# Patient Record
Sex: Male | Born: 1960 | Race: White | Hispanic: No | Marital: Married | Smoking: Current every day smoker
Health system: Southern US, Community
[De-identification: ages and names within clinical notes are randomized; demographics above are authoritative.]

## PROBLEM LIST (undated history)

## (undated) DIAGNOSIS — Z87442 Personal history of urinary calculi: Secondary | ICD-10-CM

## (undated) DIAGNOSIS — M1990 Unspecified osteoarthritis, unspecified site: Secondary | ICD-10-CM

## (undated) DIAGNOSIS — I10 Essential (primary) hypertension: Secondary | ICD-10-CM

## (undated) DIAGNOSIS — K219 Gastro-esophageal reflux disease without esophagitis: Secondary | ICD-10-CM

## (undated) HISTORY — PX: EYE SURGERY: SHX253

## (undated) HISTORY — PX: TYMPANOSTOMY TUBE PLACEMENT: SHX32

---

## 2013-12-19 ENCOUNTER — Other Ambulatory Visit: Payer: Self-pay | Admitting: Orthopedic Surgery

## 2013-12-19 DIAGNOSIS — M25561 Pain in right knee: Secondary | ICD-10-CM

## 2013-12-23 ENCOUNTER — Ambulatory Visit
Admission: RE | Admit: 2013-12-23 | Discharge: 2013-12-23 | Disposition: A | Discharge status: Home / Self Care | Payer: BC Managed Care – PPO | Source: Ambulatory Visit | Attending: Orthopedic Surgery | Admitting: Orthopedic Surgery

## 2013-12-23 DIAGNOSIS — M25561 Pain in right knee: Secondary | ICD-10-CM

## 2014-05-23 DIAGNOSIS — Z87442 Personal history of urinary calculi: Secondary | ICD-10-CM

## 2014-05-23 HISTORY — PX: OTHER SURGICAL HISTORY: SHX169

## 2014-05-23 HISTORY — DX: Personal history of urinary calculi: Z87.442

## 2016-01-04 IMAGING — MR MR KNEE*R* W/O CM
4 of 6 series · 19 of 40 positions shown · non-contrast
Comparison: None.

CLINICAL DATA: Medial and posterior right knee pain.

EXAM:
MRI OF THE RIGHT KNEE WITHOUT CONTRAST
TECHNIQUE: Multiplanar, multisequence MR imaging of the knee was performed. No
intravenous contrast was administered.

[Series 3: PD fat-sat · axial · 4.0mm · 0.27mm/px · z∈[-23,+92]mm · 7 of 24 slices shown (1 of 4)]
[im 1/24]
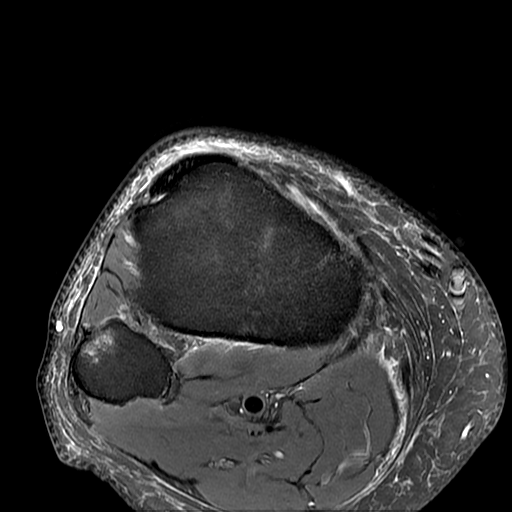
[im 4/24]
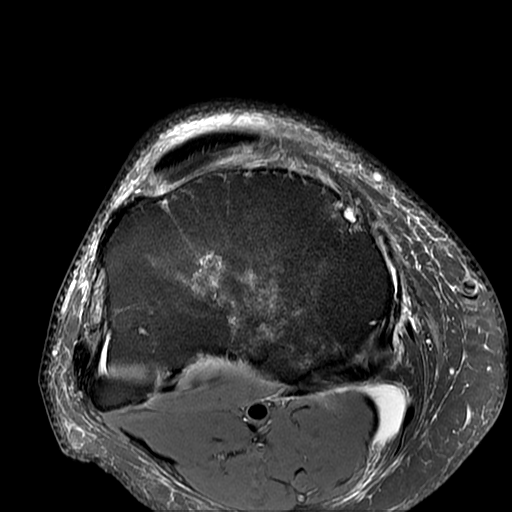
[im 8/24]
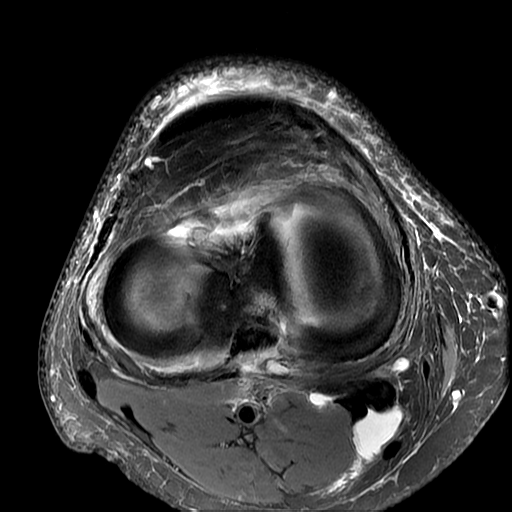
[im 12/24]
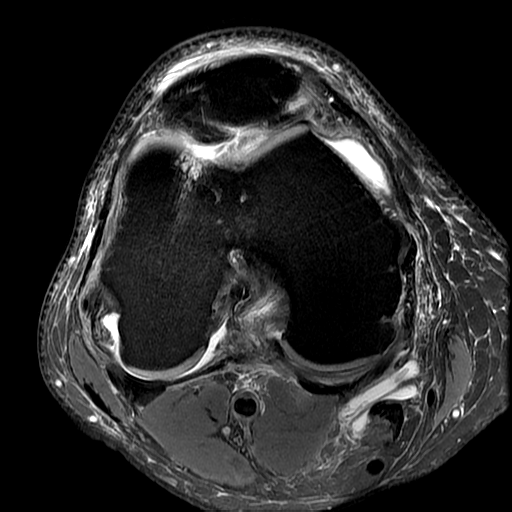
[im 16/24]
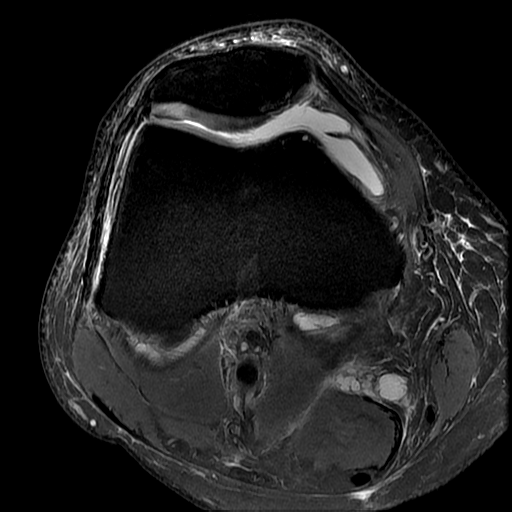
[im 20/24]
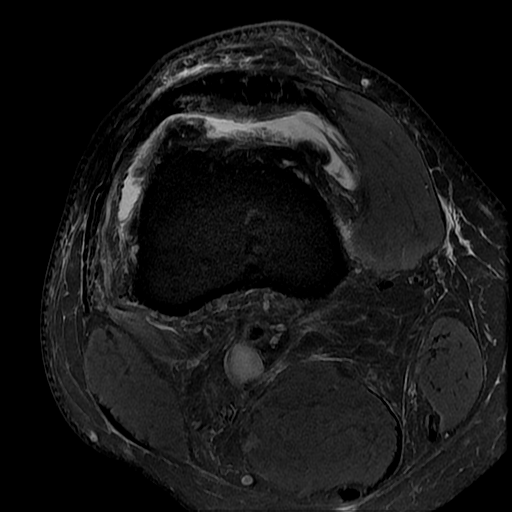
[im 24/24]
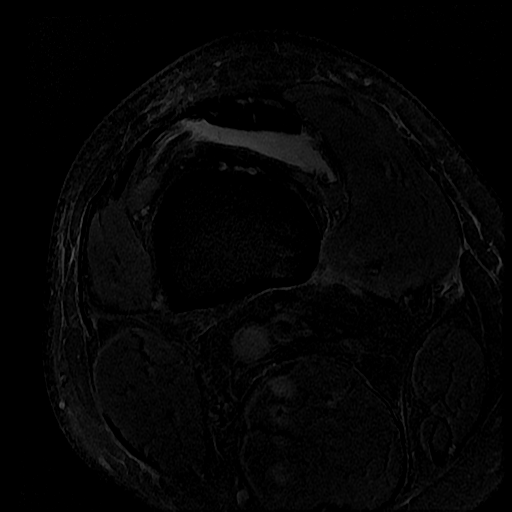

[Series 4: PD fat-sat · sagittal · 3.0mm · 0.27mm/px · 6 of 28 slices shown (2 of 4)]
[im 1/28]
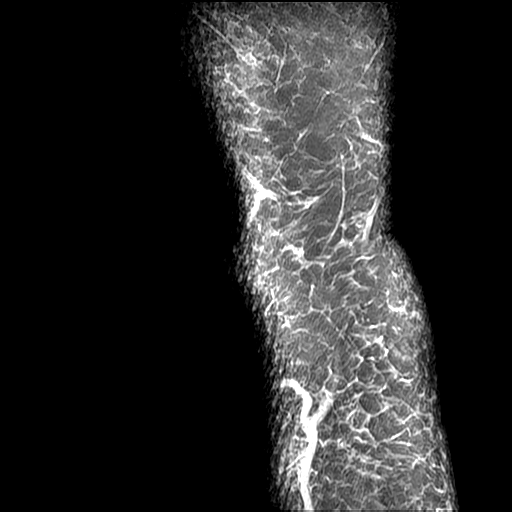
[im 4/28]
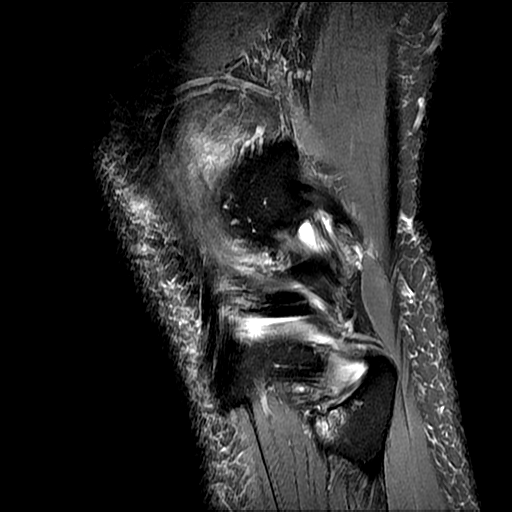
[im 8/28]
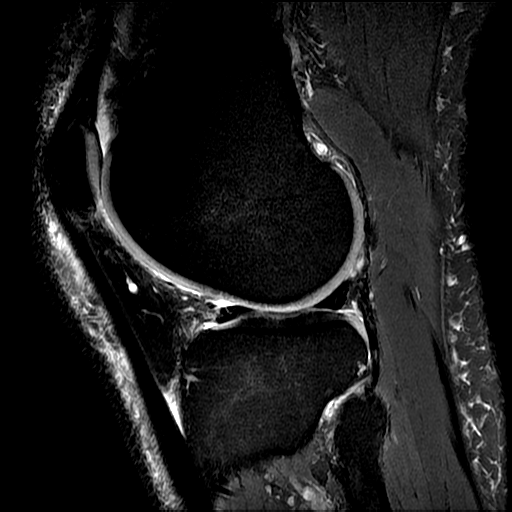
[im 12/28]
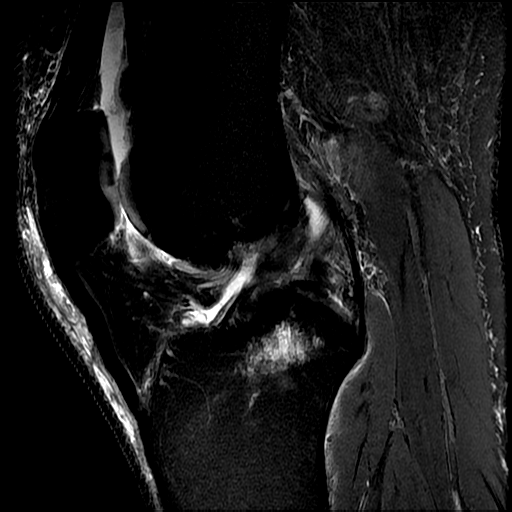
[im 16/28]
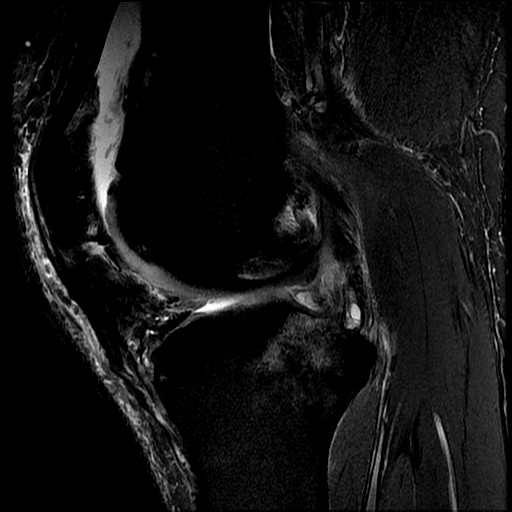
[im 24/28]
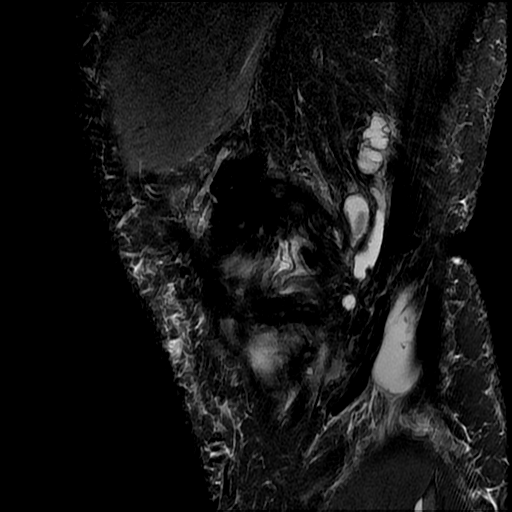

[Series 7: PD fat-sat · coronal · 4.0mm · 0.31mm/px · 3 of 26 slices shown (3 of 4)]
[im 5/26]
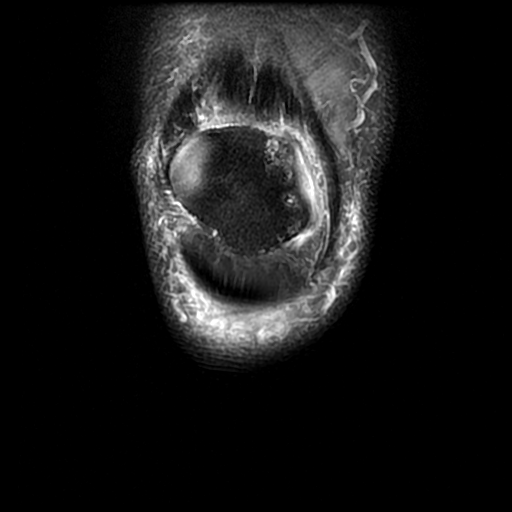
[im 13/26]
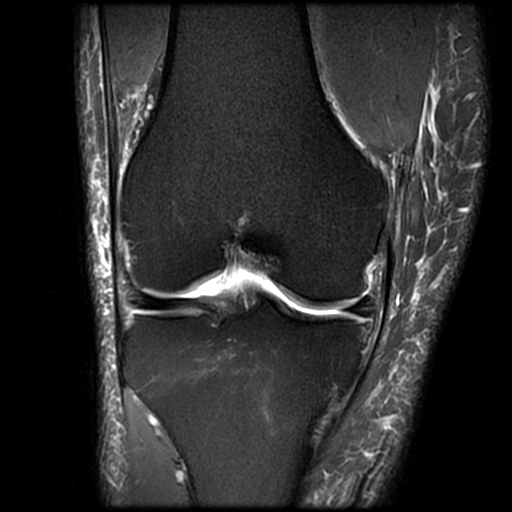
[im 21/26]
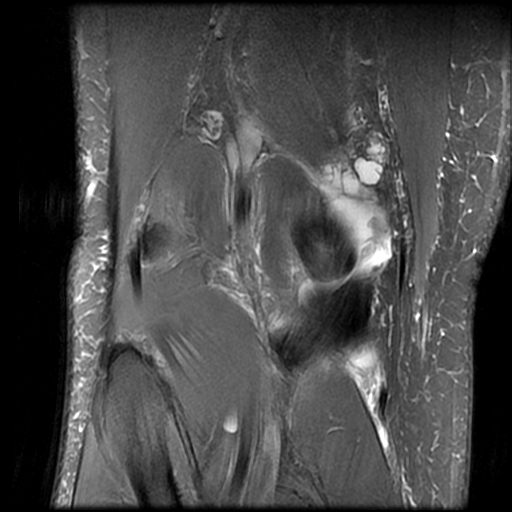

[Series 8: PD fat-sat · coronal · 2.0mm · 0.31mm/px · 3 of 13 slices shown (4 of 4)]
[im 1/13]
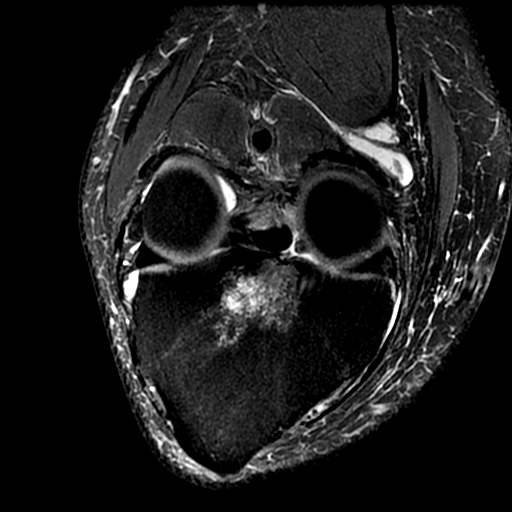
[im 9/13]
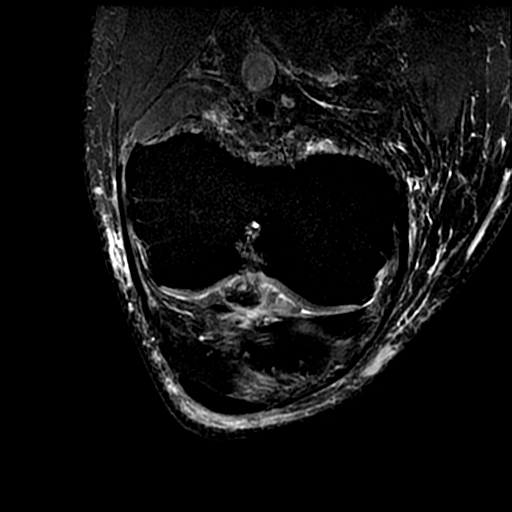
[im 13/13]
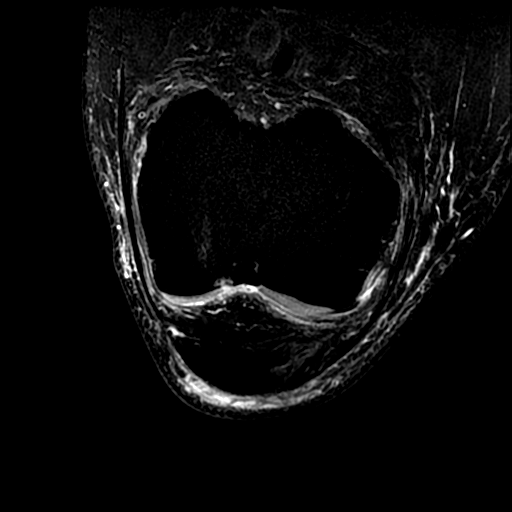

[19 of 40 positions shown; findings below may reference images not displayed]

FINDINGS: MENISCI

Medial meniscus:  Intact.

Lateral meniscus:  Intact.

LIGAMENTS

Cruciates:  Normal.

Collaterals: Intact. Small amount of fluid deep to the distal MCL
consistent with slight medial bursitis.

CARTILAGE

Patellofemoral: Grade 4 chondromalacia of the medial facet of the
patella and of the lateral aspect of the trochlear groove.

Medial:  Minimal chondromalacia of the femoral condyle.

Lateral:  Normal.

Joint:  Moderate joint effusion.

Popliteal Fossa:  6.4 x 1.5 x 1.1 cm Baker's cyst.

Extensor Mechanism:  Normal.

Bones: Focal intraosseous ganglion in the posterior central aspect
of the proximal tibia adjacent to the posterior cruciate ligament
insertion.

Osteoarthritis of the proximal tibiofibular joint with subcortical
cysts and edema.
IMPRESSION: 1. Chondromalacia of the patellofemoral compartment.
2. Moderate joint effusion with leaking Baker's cyst.
3. Osteoarthritis of the proximal tibiofibular joint.
4. Slight medial bursitis.

## 2018-01-26 DIAGNOSIS — M1711 Unilateral primary osteoarthritis, right knee: Secondary | ICD-10-CM | POA: Diagnosis present

## 2018-02-02 ENCOUNTER — Ambulatory Visit: Payer: Self-pay | Admitting: Physician Assistant

## 2018-02-02 NOTE — H&P (View-Only) (Signed)
TOTAL KNEE ADMISSION H&P  Patient is being admitted for right total knee arthroplasty.  Subjective:  Chief Complaint:right knee pain.  HPI: Hunter Vaughn, 57 y.o. male, has a history of pain and functional disability in the right knee due to arthritis and has failed non-surgical conservative treatments for greater than 12 weeks to includeNSAID's and/or analgesics, corticosteriod injections and activity modification.  Onset of symptoms was gradual, starting 6 years ago with gradually worsening course since that time. The patient noted no past surgery on the right knee(s).  Patient currently rates pain in the right knee(s) at 10 out of 10 with activity. Patient has night pain, worsening of pain with activity and weight bearing, pain that interferes with activities of daily living, pain with passive range of motion, crepitus and joint swelling.  Patient has evidence of periarticular osteophytes and joint space narrowing by imaging studies. There is no active infection.  Patient Active Problem List   Diagnosis Date Noted  . Primary localized osteoarthritis of right knee 01/26/2018   No past medical history on file.    No current outpatient medications on file.   No current facility-administered medications for this visit.    Allergies not on file  Social History   Tobacco Use  . Smoking status: Not on file  Substance Use Topics  . Alcohol use: Not on file    No family history on file.   Review of Systems  Musculoskeletal: Positive for joint pain.  All other systems reviewed and are negative.   Objective:  Physical Exam  Constitutional: He is oriented to person, place, and time. He appears well-developed and well-nourished. No distress.  HENT:  Head: Normocephalic and atraumatic.  Eyes: Pupils are equal, round, and reactive to light. Conjunctivae and EOM are normal.  Neck: Normal range of motion. Neck supple.  Cardiovascular: Normal rate, regular rhythm, normal heart sounds and  intact distal pulses.  Respiratory: Effort normal and breath sounds normal. No respiratory distress. He has no wheezes.  GI: Soft. Bowel sounds are normal. He exhibits no distension. There is no tenderness.  Musculoskeletal:       Right knee: He exhibits swelling, deformity and bony tenderness. He exhibits normal range of motion, no effusion and no erythema. Tenderness found. Medial joint line and lateral joint line tenderness noted.  Lymphadenopathy:    He has no cervical adenopathy.  Neurological: He is alert and oriented to person, place, and time.  Skin: Skin is warm and dry. No rash noted. No erythema.  Psychiatric: He has a normal mood and affect. His behavior is normal.    Vital signs in last 24 hours: @VSRANGES @  Labs:   There is no height or weight on file to calculate BMI.   Imaging Review Plain radiographs demonstrate moderate degenerative joint disease of the right knee(s). The overall alignment issignificant varus. The bone quality appears to be good for age and reported activity level.   Preoperative templating of the joint replacement has been completed, documented, and submitted to the Operating Room personnel in order to optimize intra-operative equipment management.   Anticipated LOS equal to or greater than 2 midnights due to - Age 59 and older with one or more of the following:  - Obesity  - Expected need for hospital services (PT, OT, Nursing) required for safe  discharge  - Anticipated need for postoperative skilled nursing care or inpatient rehab  - Active co-morbidities: None OR   - Unanticipated findings during/Post Surgery: None  - Patient is a  high risk of re-admission due to: None     Assessment/Plan:  End stage arthritis, right knee   The patient history, physical examination, clinical judgment of the provider and imaging studies are consistent with end stage degenerative joint disease of the right knee(s) and total knee arthroplasty is  deemed medically necessary. The treatment options including medical management, injection therapy arthroscopy and arthroplasty were discussed at length. The risks and benefits of total knee arthroplasty were presented and reviewed. The risks due to aseptic loosening, infection, stiffness, patella tracking problems, thromboembolic complications and other imponderables were discussed. The patient acknowledged the explanation, agreed to proceed with the plan and consent was signed. Patient is being admitted for inpatient treatment for surgery, pain control, PT, OT, prophylactic antibiotics, VTE prophylaxis, progressive ambulation and ADL's and discharge planning. The patient is planning to be discharged home with home health services

## 2018-02-02 NOTE — H&P (Signed)
TOTAL KNEE ADMISSION H&P  Patient is being admitted for right total knee arthroplasty.  Subjective:  Chief Complaint:right knee pain.  HPI: Hunter Vaughn, 57 y.o. male, has a history of pain and functional disability in the right knee due to arthritis and has failed non-surgical conservative treatments for greater than 12 weeks to includeNSAID's and/or analgesics, corticosteriod injections and activity modification.  Onset of symptoms was gradual, starting 6 years ago with gradually worsening course since that time. The patient noted no past surgery on the right knee(s).  Patient currently rates pain in the right knee(s) at 10 out of 10 with activity. Patient has night pain, worsening of pain with activity and weight bearing, pain that interferes with activities of daily living, pain with passive range of motion, crepitus and joint swelling.  Patient has evidence of periarticular osteophytes and joint space narrowing by imaging studies. There is no active infection.  Patient Active Problem List   Diagnosis Date Noted  . Primary localized osteoarthritis of right knee 01/26/2018   No past medical history on file.    No current outpatient medications on file.   No current facility-administered medications for this visit.    Allergies not on file  Social History   Tobacco Use  . Smoking status: Not on file  Substance Use Topics  . Alcohol use: Not on file    No family history on file.   Review of Systems  Musculoskeletal: Positive for joint pain.  All other systems reviewed and are negative.   Objective:  Physical Exam  Constitutional: He is oriented to person, place, and time. He appears well-developed and well-nourished. No distress.  HENT:  Head: Normocephalic and atraumatic.  Eyes: Pupils are equal, round, and reactive to light. Conjunctivae and EOM are normal.  Neck: Normal range of motion. Neck supple.  Cardiovascular: Normal rate, regular rhythm, normal heart sounds and  intact distal pulses.  Respiratory: Effort normal and breath sounds normal. No respiratory distress. He has no wheezes.  GI: Soft. Bowel sounds are normal. He exhibits no distension. There is no tenderness.  Musculoskeletal:       Right knee: He exhibits swelling, deformity and bony tenderness. He exhibits normal range of motion, no effusion and no erythema. Tenderness found. Medial joint line and lateral joint line tenderness noted.  Lymphadenopathy:    He has no cervical adenopathy.  Neurological: He is alert and oriented to person, place, and time.  Skin: Skin is warm and dry. No rash noted. No erythema.  Psychiatric: He has a normal mood and affect. His behavior is normal.    Vital signs in last 24 hours: @VSRANGES@  Labs:   There is no height or weight on file to calculate BMI.   Imaging Review Plain radiographs demonstrate moderate degenerative joint disease of the right knee(s). The overall alignment issignificant varus. The bone quality appears to be good for age and reported activity level.   Preoperative templating of the joint replacement has been completed, documented, and submitted to the Operating Room personnel in order to optimize intra-operative equipment management.   Anticipated LOS equal to or greater than 2 midnights due to - Age 65 and older with one or more of the following:  - Obesity  - Expected need for hospital services (PT, OT, Nursing) required for safe  discharge  - Anticipated need for postoperative skilled nursing care or inpatient rehab  - Active co-morbidities: None OR   - Unanticipated findings during/Post Surgery: None  - Patient is a   high risk of re-admission due to: None     Assessment/Plan:  End stage arthritis, right knee   The patient history, physical examination, clinical judgment of the provider and imaging studies are consistent with end stage degenerative joint disease of the right knee(s) and total knee arthroplasty is  deemed medically necessary. The treatment options including medical management, injection therapy arthroscopy and arthroplasty were discussed at length. The risks and benefits of total knee arthroplasty were presented and reviewed. The risks due to aseptic loosening, infection, stiffness, patella tracking problems, thromboembolic complications and other imponderables were discussed. The patient acknowledged the explanation, agreed to proceed with the plan and consent was signed. Patient is being admitted for inpatient treatment for surgery, pain control, PT, OT, prophylactic antibiotics, VTE prophylaxis, progressive ambulation and ADL's and discharge planning. The patient is planning to be discharged home with home health services  

## 2018-02-08 ENCOUNTER — Other Ambulatory Visit (HOSPITAL_COMMUNITY): Payer: Self-pay | Admitting: *Deleted

## 2018-02-08 NOTE — Progress Notes (Signed)
Medical /cardiac clearance note amy moon np 01-04-18 on chart

## 2018-02-08 NOTE — Patient Instructions (Addendum)
Hunter Vaughn  02/08/2018   Your procedure is scheduled on: 02-16-18  [facility]  Report to admitting at  745 AM    Call this number if you have problems the morning of surgery [phone]   Remember: Do not eat food or drink liquids :After Midnight. BRUSH YOUR TEETH MORNING OF SURGERY AND RINSE YOUR MOUTH OUT, NO CHEWING GUM CANDY OR MINTS.     Take these medicines the morning of surgery with A SIP OF WATER: ALBUTEROL INHALER IF NEEDED AND BRING INHALER, ROSUVASTATIN (CRESTOR), PANTAPRAZOLE (PROTONIX)                               You may not have any metal on your body including hair pins and              piercings  Do not wear jewelry, make-up, lotions, powders or perfumes, deodorant             Do not wear nail polish.  Do not shave  48 hours prior to surgery.              Men may shave face and neck.   Do not bring valuables to the hospital. [place] IS NOT             RESPONSIBLE   FOR VALUABLES.  Contacts, dentures or bridgework may not be worn into surgery.  Leave suitcase in the car. After surgery it may be brought to your room.     Patients discharged the day of surgery will not be allowed to drive home.  Name and phone number of your driver:  Special Instructions: N/A              Please read over the following fact sheets you were given: _____________________________________________________________________             [facility] Before surgery, you can play an important role.  Because skin is not sterile, your skin needs to be as free of germs as possible.  You can reduce the number of germs on your skin by washing with CHG (chlorahexidine gluconate) soap before surgery.  CHG is an antiseptic cleaner which kills germs and bonds with the skin to continue killing germs even after washing. Please DO NOT use if you have an allergy to CHG or antibacterial soaps.  If your skin becomes  reddened/irritated stop using the CHG and inform your nurse when you arrive at Short Stay. Do not shave (including legs and underarms) for at least 48 hours prior to the first CHG shower.  You may shave your face/neck. Please follow these instructions carefully:  1.  Shower with CHG Soap the night before surgery and the  morning of Surgery.  2.  If you choose to wash your hair, wash your hair first as usual with your  normal  shampoo.  3.  After you shampoo, rinse your hair and body thoroughly to remove the  shampoo.                           4.  Use CHG as you would any other liquid soap.  You can apply chg directly  to the skin and wash  Gently with a scrungie or clean washcloth.  5.  Apply the CHG Soap to your body ONLY FROM THE NECK DOWN.   Do not use on face/ open                           Wound or open sores. Avoid contact with eyes, ears mouth and genitals (private parts).                       Wash face,  Genitals (private parts) with your normal soap.             6.  Wash thoroughly, paying special attention to the area where your surgery  will be performed.  7.  Thoroughly rinse your body with warm water from the neck down.  8.  DO NOT shower/wash with your normal soap after using and rinsing off  the CHG Soap.                9.  Pat yourself dry with a clean towel.            10.  Wear clean pajamas.            11.  Place clean sheets on your bed the night of your first shower and do not  sleep with pets. Day of Surgery : Do not apply any lotions/deodorants the morning of surgery.  Please wear clean clothes to the hospital/surgery center.  FAILURE TO FOLLOW THESE INSTRUCTIONS MAY RESULT IN THE CANCELLATION OF YOUR SURGERY PATIENT SIGNATURE_________________________________  NURSE SIGNATURE__________________________________  ________________________________________________________________________  WHAT IS A BLOOD TRANSFUSION? Blood Transfusion Information  A  transfusion is the replacement of blood or some of its parts. Blood is made up of multiple cells which provide different functions.  Red blood cells carry oxygen and are used for blood loss replacement.  White blood cells fight against infection.  Platelets control bleeding.  Plasma helps clot blood.  Other blood products are available for specialized needs, such as hemophilia or other clotting disorders. BEFORE THE TRANSFUSION  Who gives blood for transfusions?   Healthy volunteers who are fully evaluated to make sure their blood is safe. This is blood bank blood. Transfusion therapy is the safest it has ever been in the practice of medicine. Before blood is taken from a donor, a complete history is taken to make sure that person has no history of diseases nor engages in risky social behavior (examples are intravenous drug use or sexual activity with multiple partners). The donor's travel history is screened to minimize risk of transmitting infections, such as malaria. The donated blood is tested for signs of infectious diseases, such as HIV and hepatitis. The blood is then tested to be sure it is compatible with you in order to minimize the chance of a transfusion reaction. If you or a relative donates blood, this is often done in anticipation of surgery and is not appropriate for emergency situations. It takes many days to process the donated blood. RISKS AND COMPLICATIONS Although transfusion therapy is very safe and saves many lives, the main dangers of transfusion include:   Getting an infectious disease.  Developing a transfusion reaction. This is an allergic reaction to something in the blood you were given. Every precaution is taken to prevent this. The decision to have a blood transfusion has been considered carefully by your caregiver before blood is given. Blood is not given unless the benefits outweigh  the risks. AFTER THE TRANSFUSION  Right after receiving a blood transfusion,  you will usually feel much better and more energetic. This is especially true if your red blood cells have gotten low (anemic). The transfusion raises the level of the red blood cells which carry oxygen, and this usually causes an energy increase.  The nurse administering the transfusion will monitor you carefully for complications. HOME CARE INSTRUCTIONS  No special instructions are needed after a transfusion. You may find your energy is better. Speak with your caregiver about any limitations on activity for underlying diseases you may have. SEEK MEDICAL CARE IF:   Your condition is not improving after your transfusion.  You develop redness or irritation at the intravenous (IV) site. SEEK IMMEDIATE MEDICAL CARE IF:  Any of the following symptoms occur over the next 12 hours:  Shaking chills.  You have a temperature by mouth above 102 F (38.9 C), not controlled by medicine.  Chest, back, or muscle pain.  People around you feel you are not acting correctly or are confused.  Shortness of breath or difficulty breathing.  Dizziness and fainting.  You get a rash or develop hives.  You have a decrease in urine output.  Your urine turns a dark color or changes to pink, red, or brown. Any of the following symptoms occur over the next 10 days:  You have a temperature by mouth above 102 F (38.9 C), not controlled by medicine.  Shortness of breath.  Weakness after normal activity.  The white part of the eye turns yellow (jaundice).  You have a decrease in the amount of urine or are urinating less often.  Your urine turns a dark color or changes to pink, red, or brown. Document Released: 05/06/2000 Document Revised: 08/01/2011 Document Reviewed: 12/24/2007 [facility] Patient Information 2014 [place], [place].  _______________________________________________________________________

## 2018-02-12 ENCOUNTER — Other Ambulatory Visit: Payer: Self-pay

## 2018-02-12 ENCOUNTER — Encounter (HOSPITAL_COMMUNITY)
Admission: RE | Admit: 2018-02-12 | Discharge: 2018-02-12 | Disposition: A | Discharge status: Home / Self Care | Payer: Managed Care, Other (non HMO) | Source: Ambulatory Visit | Attending: Orthopedic Surgery | Admitting: Orthopedic Surgery

## 2018-02-12 ENCOUNTER — Ambulatory Visit (HOSPITAL_COMMUNITY)
Admission: RE | Admit: 2018-02-12 | Discharge: 2018-02-12 | Disposition: A | Discharge status: Home / Self Care | Payer: Managed Care, Other (non HMO) | Source: Ambulatory Visit | Attending: Physician Assistant | Admitting: Physician Assistant

## 2018-02-12 ENCOUNTER — Encounter (HOSPITAL_COMMUNITY): Payer: Self-pay

## 2018-02-12 DIAGNOSIS — M1711 Unilateral primary osteoarthritis, right knee: Secondary | ICD-10-CM | POA: Insufficient documentation

## 2018-02-12 DIAGNOSIS — Z01818 Encounter for other preprocedural examination: Secondary | ICD-10-CM | POA: Insufficient documentation

## 2018-02-12 HISTORY — DX: Gastro-esophageal reflux disease without esophagitis: K21.9

## 2018-02-12 HISTORY — DX: Unspecified osteoarthritis, unspecified site: M19.90

## 2018-02-12 HISTORY — DX: Essential (primary) hypertension: I10

## 2018-02-12 HISTORY — DX: Personal history of urinary calculi: Z87.442

## 2018-02-12 LAB — CBC WITH DIFFERENTIAL/PLATELET
BASOS PCT: 0 %
Basophils Absolute: 0 10*3/uL (ref 0.0–0.1)
EOS ABS: 0.4 10*3/uL (ref 0.0–0.7)
EOS PCT: 3 %
HCT: 48.5 % (ref 39.0–52.0)
HEMOGLOBIN: 16.2 g/dL (ref 13.0–17.0)
Lymphocytes Relative: 22 %
Lymphs Abs: 2.5 10*3/uL (ref 0.7–4.0)
MCH: 30.3 pg (ref 26.0–34.0)
MCHC: 33.4 g/dL (ref 30.0–36.0)
MCV: 90.7 fL (ref 78.0–100.0)
MONO ABS: 1 10*3/uL (ref 0.1–1.0)
Monocytes Relative: 8 %
NEUTROS PCT: 67 %
Neutro Abs: 7.6 10*3/uL (ref 1.7–7.7)
PLATELETS: 287 10*3/uL (ref 150–400)
RBC: 5.35 MIL/uL (ref 4.22–5.81)
RDW: 13.6 % (ref 11.5–15.5)
WBC: 11.5 10*3/uL — ABNORMAL HIGH (ref 4.0–10.5)

## 2018-02-12 LAB — COMPREHENSIVE METABOLIC PANEL
ALBUMIN: 4.2 g/dL (ref 3.5–5.0)
ALT: 54 U/L — ABNORMAL HIGH (ref 0–44)
ANION GAP: 10 (ref 5–15)
AST: 31 U/L (ref 15–41)
Alkaline Phosphatase: 87 U/L (ref 38–126)
BUN: 16 mg/dL (ref 6–20)
CO2: 33 mmol/L — AB (ref 22–32)
Calcium: 9.5 mg/dL (ref 8.9–10.3)
Chloride: 99 mmol/L (ref 98–111)
Creatinine, Ser: 0.94 mg/dL (ref 0.61–1.24)
GFR calc non Af Amer: >60 mL/min (ref >60)
GLUCOSE: 103 mg/dL — AB (ref 70–99)
POTASSIUM: 4.2 mmol/L (ref 3.5–5.1)
SODIUM: 142 mmol/L (ref 135–145)
Total Bilirubin: 0.7 mg/dL (ref 0.3–1.2)
Total Protein: 7.5 g/dL (ref 6.5–8.1)

## 2018-02-12 LAB — URINALYSIS, ROUTINE W REFLEX MICROSCOPIC
BILIRUBIN URINE: NEGATIVE
GLUCOSE, UA: NEGATIVE mg/dL
HGB URINE DIPSTICK: NEGATIVE
Ketones, ur: NEGATIVE mg/dL
LEUKOCYTES UA: NEGATIVE
Nitrite: NEGATIVE
PH: 7 (ref 5.0–8.0)
PROTEIN: NEGATIVE mg/dL
Specific Gravity, Urine: 1.021 (ref 1.005–1.030)

## 2018-02-12 LAB — PROTIME-INR
INR: 0.9
Prothrombin Time: 12.1 seconds (ref 11.4–15.2)

## 2018-02-12 LAB — SURGICAL PCR SCREEN
MRSA, PCR: NEGATIVE
Staphylococcus aureus: NEGATIVE

## 2018-02-12 LAB — APTT: aPTT: 36 seconds (ref 24–36)

## 2018-02-12 NOTE — Progress Notes (Signed)
Chart left with Hunter Vaughn for follow up type and screen and urine culture

## 2018-02-13 LAB — URINE CULTURE: CULTURE: NO GROWTH

## 2018-02-13 LAB — ABO/RH: ABO/RH(D): O POS

## 2018-02-15 MED ORDER — BUPIVACAINE LIPOSOME 1.3 % IJ SUSP
20.0000 mL | Freq: Once | INTRAMUSCULAR | Status: DC
  Filled 2018-02-15: qty 20

## 2018-02-15 MED ORDER — TRANEXAMIC ACID 1000 MG/10ML IV SOLN
2000.0000 mg | INTRAVENOUS | Status: DC
  Filled 2018-02-15: qty 20

## 2018-02-16 ENCOUNTER — Ambulatory Visit (HOSPITAL_COMMUNITY): Payer: Managed Care, Other (non HMO) | Admitting: Certified Registered Nurse Anesthetist

## 2018-02-16 ENCOUNTER — Inpatient Hospital Stay (HOSPITAL_COMMUNITY)
Admission: AD | Admit: 2018-02-16 | Discharge: 2018-02-17 | DRG: 470 | Disposition: A | Discharge status: Home Health | Payer: Managed Care, Other (non HMO) | Source: Ambulatory Visit | Attending: Orthopedic Surgery | Admitting: Orthopedic Surgery

## 2018-02-16 ENCOUNTER — Encounter (HOSPITAL_COMMUNITY)
Admission: AD | Disposition: A | Discharge status: Home Health | Payer: Self-pay | Source: Ambulatory Visit | Attending: Orthopedic Surgery

## 2018-02-16 ENCOUNTER — Other Ambulatory Visit: Payer: Self-pay

## 2018-02-16 ENCOUNTER — Encounter (HOSPITAL_COMMUNITY): Payer: Self-pay | Admitting: Emergency Medicine

## 2018-02-16 DIAGNOSIS — Z96659 Presence of unspecified artificial knee joint: Secondary | ICD-10-CM

## 2018-02-16 DIAGNOSIS — E669 Obesity, unspecified: Secondary | ICD-10-CM | POA: Diagnosis present

## 2018-02-16 DIAGNOSIS — K219 Gastro-esophageal reflux disease without esophagitis: Secondary | ICD-10-CM | POA: Diagnosis present

## 2018-02-16 DIAGNOSIS — I10 Essential (primary) hypertension: Secondary | ICD-10-CM | POA: Diagnosis present

## 2018-02-16 DIAGNOSIS — F17200 Nicotine dependence, unspecified, uncomplicated: Secondary | ICD-10-CM | POA: Diagnosis present

## 2018-02-16 DIAGNOSIS — M1711 Unilateral primary osteoarthritis, right knee: Principal | ICD-10-CM | POA: Diagnosis present

## 2018-02-16 DIAGNOSIS — J449 Chronic obstructive pulmonary disease, unspecified: Secondary | ICD-10-CM | POA: Diagnosis present

## 2018-02-16 DIAGNOSIS — Z6833 Body mass index (BMI) 33.0-33.9, adult: Secondary | ICD-10-CM | POA: Diagnosis not present

## 2018-02-16 HISTORY — PX: TOTAL KNEE ARTHROPLASTY: SHX125

## 2018-02-16 LAB — TYPE AND SCREEN
ABO/RH(D): O POS
ANTIBODY SCREEN: NEGATIVE

## 2018-02-16 SURGERY — ARTHROPLASTY, KNEE, TOTAL
Anesthesia: Spinal | Site: Knee | Laterality: Right

## 2018-02-16 MED ORDER — ACETAMINOPHEN 325 MG PO TABS: 650.0000 mg | ORAL_TABLET | Freq: Four times a day (QID) | ORAL | 0 refills | Status: AC | PRN

## 2018-02-16 MED ORDER — MEPERIDINE HCL 50 MG/ML IJ SOLN: 6.2500 mg | INTRAMUSCULAR | Status: DC | PRN

## 2018-02-16 MED ORDER — PSEUDOEPHEDRINE HCL ER 120 MG PO TB12
120.0000 mg | ORAL_TABLET | Freq: Two times a day (BID) | ORAL | Status: DC
  Administered 2018-02-16 – 2018-02-17 (×2): 120 mg via ORAL
  Filled 2018-02-16 (×2): qty 1

## 2018-02-16 MED ORDER — LORATADINE 10 MG PO TABS
10.0000 mg | ORAL_TABLET | Freq: Every day | ORAL | Status: DC
  Administered 2018-02-16 – 2018-02-17 (×2): 10 mg via ORAL
  Filled 2018-02-16 (×2): qty 1

## 2018-02-16 MED ORDER — TRANEXAMIC ACID 1000 MG/10ML IV SOLN
INTRAVENOUS | Status: DC | PRN
  Administered 2018-02-16: 2000 mg via TOPICAL

## 2018-02-16 MED ORDER — CHLORHEXIDINE GLUCONATE 4 % EX LIQD: 60.0000 mL | Freq: Once | CUTANEOUS | Status: DC

## 2018-02-16 MED ORDER — PANTOPRAZOLE SODIUM 40 MG PO TBEC
40.0000 mg | DELAYED_RELEASE_TABLET | Freq: Every day | ORAL | Status: DC
  Administered 2018-02-16 – 2018-02-17 (×2): 40 mg via ORAL
  Filled 2018-02-16 (×2): qty 1

## 2018-02-16 MED ORDER — HYDROMORPHONE HCL 1 MG/ML IJ SOLN
0.5000 mg | INTRAMUSCULAR | Status: DC | PRN
  Administered 2018-02-16: 0.5 mg via INTRAVENOUS
  Filled 2018-02-16: qty 1

## 2018-02-16 MED ORDER — TRANEXAMIC ACID 1000 MG/10ML IV SOLN
1000.0000 mg | INTRAVENOUS | Status: AC
  Administered 2018-02-16: 1000 mg via INTRAVENOUS
  Filled 2018-02-16: qty 10

## 2018-02-16 MED ORDER — PROMETHAZINE HCL 25 MG/ML IJ SOLN: 6.2500 mg | INTRAMUSCULAR | Status: DC | PRN

## 2018-02-16 MED ORDER — DOCUSATE SODIUM 100 MG PO CAPS
100.0000 mg | ORAL_CAPSULE | Freq: Two times a day (BID) | ORAL | Status: DC
  Administered 2018-02-16 – 2018-02-17 (×2): 100 mg via ORAL
  Filled 2018-02-16 (×2): qty 1

## 2018-02-16 MED ORDER — ONDANSETRON HCL 4 MG/2ML IJ SOLN
INTRAMUSCULAR | Status: DC | PRN
  Administered 2018-02-16: 4 mg via INTRAVENOUS

## 2018-02-16 MED ORDER — ALBUTEROL SULFATE HFA 108 (90 BASE) MCG/ACT IN AERS: 1.0000 | INHALATION_SPRAY | Freq: Four times a day (QID) | RESPIRATORY_TRACT | Status: DC | PRN

## 2018-02-16 MED ORDER — METOCLOPRAMIDE HCL 5 MG/ML IJ SOLN: 5.0000 mg | Freq: Three times a day (TID) | INTRAMUSCULAR | Status: DC | PRN

## 2018-02-16 MED ORDER — ACETAMINOPHEN 500 MG PO TABS
1000.0000 mg | ORAL_TABLET | Freq: Four times a day (QID) | ORAL | Status: DC
  Administered 2018-02-16 – 2018-02-17 (×3): 1000 mg via ORAL
  Filled 2018-02-16 (×3): qty 2

## 2018-02-16 MED ORDER — IRBESARTAN 150 MG PO TABS
150.0000 mg | ORAL_TABLET | Freq: Every day | ORAL | Status: DC
  Administered 2018-02-17: 150 mg via ORAL
  Filled 2018-02-16: qty 1

## 2018-02-16 MED ORDER — PROPOFOL 10 MG/ML IV BOLUS
INTRAVENOUS | Status: DC | PRN
  Administered 2018-02-16: 20 mg via INTRAVENOUS
  Administered 2018-02-16: 10 mg via INTRAVENOUS
  Administered 2018-02-16 (×4): 20 mg via INTRAVENOUS
  Administered 2018-02-16 (×3): 10 mg via INTRAVENOUS

## 2018-02-16 MED ORDER — 0.9 % SODIUM CHLORIDE (POUR BTL) OPTIME
TOPICAL | Status: DC | PRN
  Administered 2018-02-16: 1000 mL

## 2018-02-16 MED ORDER — FENTANYL CITRATE (PF) 100 MCG/2ML IJ SOLN
50.0000 ug | INTRAMUSCULAR | Status: DC
  Administered 2018-02-16: 100 ug via INTRAVENOUS
  Filled 2018-02-16: qty 2

## 2018-02-16 MED ORDER — SODIUM CHLORIDE 0.9 % IJ SOLN
INTRAMUSCULAR | Status: DC | PRN
  Administered 2018-02-16: 50 mL via INTRAVENOUS

## 2018-02-16 MED ORDER — ROSUVASTATIN CALCIUM 5 MG PO TABS
5.0000 mg | ORAL_TABLET | Freq: Every day | ORAL | Status: DC
  Administered 2018-02-16 – 2018-02-17 (×2): 5 mg via ORAL
  Filled 2018-02-16 (×2): qty 1

## 2018-02-16 MED ORDER — PHENYLEPHRINE 40 MCG/ML (10ML) SYRINGE FOR IV PUSH (FOR BLOOD PRESSURE SUPPORT)
PREFILLED_SYRINGE | INTRAVENOUS | Status: DC | PRN
  Administered 2018-02-16: 80 ug via INTRAVENOUS
  Administered 2018-02-16 (×3): 40 ug via INTRAVENOUS
  Administered 2018-02-16: 80 ug via INTRAVENOUS
  Administered 2018-02-16 (×2): 40 ug via INTRAVENOUS

## 2018-02-16 MED ORDER — PROPOFOL 10 MG/ML IV BOLUS
INTRAVENOUS | Status: AC
  Filled 2018-02-16: qty 60

## 2018-02-16 MED ORDER — ROPIVACAINE HCL 7.5 MG/ML IJ SOLN
INTRAMUSCULAR | Status: DC | PRN
  Administered 2018-02-16: 20 mL via PERINEURAL

## 2018-02-16 MED ORDER — OXYCODONE HCL 5 MG PO TABS
5.0000 mg | ORAL_TABLET | ORAL | Status: DC | PRN
  Administered 2018-02-16: 5 mg via ORAL
  Administered 2018-02-16 – 2018-02-17 (×3): 10 mg via ORAL
  Filled 2018-02-16: qty 2
  Filled 2018-02-16: qty 1
  Filled 2018-02-16 (×3): qty 2

## 2018-02-16 MED ORDER — SODIUM CHLORIDE 0.9 % IV SOLN
INTRAVENOUS | Status: DC
  Administered 2018-02-16: 75 mL/h via INTRAVENOUS
  Administered 2018-02-17: 04:00:00 via INTRAVENOUS

## 2018-02-16 MED ORDER — SODIUM CHLORIDE 0.9 % IV SOLN: INTRAVENOUS | Status: DC

## 2018-02-16 MED ORDER — MIDAZOLAM HCL 2 MG/2ML IJ SOLN: 0.5000 mg | Freq: Once | INTRAMUSCULAR | Status: DC | PRN

## 2018-02-16 MED ORDER — APIXABAN 2.5 MG PO TABS
2.5000 mg | ORAL_TABLET | Freq: Two times a day (BID) | ORAL | Status: DC
  Administered 2018-02-17: 2.5 mg via ORAL
  Filled 2018-02-16: qty 1

## 2018-02-16 MED ORDER — METOCLOPRAMIDE HCL 5 MG PO TABS: 5.0000 mg | ORAL_TABLET | Freq: Three times a day (TID) | ORAL | Status: DC | PRN

## 2018-02-16 MED ORDER — ONDANSETRON HCL 4 MG PO TABS: 4.0000 mg | ORAL_TABLET | Freq: Four times a day (QID) | ORAL | Status: DC | PRN

## 2018-02-16 MED ORDER — STERILE WATER FOR IRRIGATION IR SOLN
Status: DC | PRN
  Administered 2018-02-16: 2000 mL

## 2018-02-16 MED ORDER — DOCUSATE SODIUM 100 MG PO CAPS: 100.0000 mg | ORAL_CAPSULE | Freq: Every day | ORAL | 2 refills | Status: AC | PRN

## 2018-02-16 MED ORDER — POLYETHYLENE GLYCOL 3350 17 G PO PACK: 17.0000 g | PACK | Freq: Every day | ORAL | Status: DC | PRN

## 2018-02-16 MED ORDER — DEXAMETHASONE SODIUM PHOSPHATE 10 MG/ML IJ SOLN
INTRAMUSCULAR | Status: DC | PRN
  Administered 2018-02-16: 10 mg via INTRAVENOUS

## 2018-02-16 MED ORDER — BUPIVACAINE LIPOSOME 1.3 % IJ SUSP
INTRAMUSCULAR | Status: DC | PRN
  Administered 2018-02-16: 20 mL

## 2018-02-16 MED ORDER — CELECOXIB 200 MG PO CAPS
200.0000 mg | ORAL_CAPSULE | Freq: Every day | ORAL | Status: DC
  Administered 2018-02-16 – 2018-02-17 (×2): 200 mg via ORAL
  Filled 2018-02-16 (×2): qty 1

## 2018-02-16 MED ORDER — HYDROMORPHONE HCL 1 MG/ML IJ SOLN: 0.2500 mg | INTRAMUSCULAR | Status: DC | PRN

## 2018-02-16 MED ORDER — OXYCODONE HCL 5 MG PO TABS: ORAL_TABLET | ORAL | 0 refills | Status: AC

## 2018-02-16 MED ORDER — ASPIRIN EC 81 MG PO TBEC: 81.0000 mg | DELAYED_RELEASE_TABLET | Freq: Two times a day (BID) | ORAL | 0 refills | Status: AC

## 2018-02-16 MED ORDER — PROPOFOL 10 MG/ML IV BOLUS
INTRAVENOUS | Status: AC
  Filled 2018-02-16: qty 40

## 2018-02-16 MED ORDER — ONDANSETRON HCL 4 MG/2ML IJ SOLN
INTRAMUSCULAR | Status: AC
  Filled 2018-02-16: qty 2

## 2018-02-16 MED ORDER — TELMISARTAN-HCTZ 40-12.5 MG PO TABS: 1.0000 | ORAL_TABLET | Freq: Every day | ORAL | Status: DC

## 2018-02-16 MED ORDER — BUPIVACAINE-EPINEPHRINE 0.5% -1:200000 IJ SOLN
INTRAMUSCULAR | Status: DC | PRN
  Administered 2018-02-16: 20 mL

## 2018-02-16 MED ORDER — DEXAMETHASONE SODIUM PHOSPHATE 10 MG/ML IJ SOLN
INTRAMUSCULAR | Status: AC
  Filled 2018-02-16: qty 1

## 2018-02-16 MED ORDER — LORATADINE-PSEUDOEPHEDRINE ER 5-120 MG PO TB12: 1.0000 | ORAL_TABLET | Freq: Every day | ORAL | Status: DC

## 2018-02-16 MED ORDER — MENTHOL 3 MG MT LOZG: 1.0000 | LOZENGE | OROMUCOSAL | Status: DC | PRN

## 2018-02-16 MED ORDER — ACETAMINOPHEN 325 MG PO TABS: 325.0000 mg | ORAL_TABLET | Freq: Four times a day (QID) | ORAL | Status: DC | PRN

## 2018-02-16 MED ORDER — TEMAZEPAM 15 MG PO CAPS: 15.0000 mg | ORAL_CAPSULE | Freq: Every evening | ORAL | Status: DC | PRN

## 2018-02-16 MED ORDER — LACTATED RINGERS IV SOLN
INTRAVENOUS | Status: DC
  Administered 2018-02-16 (×2): via INTRAVENOUS

## 2018-02-16 MED ORDER — PHENOL 1.4 % MT LIQD: 1.0000 | OROMUCOSAL | Status: DC | PRN

## 2018-02-16 MED ORDER — HYDROMORPHONE HCL 2 MG/ML IJ SOLN
INTRAMUSCULAR | Status: AC
  Filled 2018-02-16: qty 1

## 2018-02-16 MED ORDER — TRANEXAMIC ACID 1000 MG/10ML IV SOLN
1000.0000 mg | Freq: Once | INTRAVENOUS | Status: AC
  Administered 2018-02-16: 1000 mg via INTRAVENOUS
  Filled 2018-02-16: qty 1000

## 2018-02-16 MED ORDER — EPHEDRINE 5 MG/ML INJ
INTRAVENOUS | Status: AC
  Filled 2018-02-16: qty 10

## 2018-02-16 MED ORDER — FLEET ENEMA 7-19 GM/118ML RE ENEM: 1.0000 | ENEMA | Freq: Once | RECTAL | Status: DC | PRN

## 2018-02-16 MED ORDER — BUPIVACAINE IN DEXTROSE 0.75-8.25 % IT SOLN
INTRATHECAL | Status: DC | PRN
  Administered 2018-02-16: 1.8 mL via INTRATHECAL

## 2018-02-16 MED ORDER — METHOCARBAMOL 500 MG PO TABS
ORAL_TABLET | ORAL | Status: AC
  Filled 2018-02-16: qty 1

## 2018-02-16 MED ORDER — PROPOFOL 10 MG/ML IV BOLUS
INTRAVENOUS | Status: AC
  Filled 2018-02-16: qty 20

## 2018-02-16 MED ORDER — HYDROCHLOROTHIAZIDE 12.5 MG PO CAPS
12.5000 mg | ORAL_CAPSULE | Freq: Every day | ORAL | Status: DC
  Administered 2018-02-17: 12.5 mg via ORAL
  Filled 2018-02-16: qty 1

## 2018-02-16 MED ORDER — BUPIVACAINE-EPINEPHRINE (PF) 0.5% -1:200000 IJ SOLN
INTRAMUSCULAR | Status: AC
  Filled 2018-02-16: qty 30

## 2018-02-16 MED ORDER — SODIUM CHLORIDE 0.9 % IJ SOLN
INTRAMUSCULAR | Status: AC
  Filled 2018-02-16: qty 50

## 2018-02-16 MED ORDER — METHOCARBAMOL 500 MG PO TABS
500.0000 mg | ORAL_TABLET | Freq: Four times a day (QID) | ORAL | Status: DC | PRN
  Administered 2018-02-16 (×2): 500 mg via ORAL
  Filled 2018-02-16: qty 1

## 2018-02-16 MED ORDER — NICOTINE 21 MG/24HR TD PT24
21.0000 mg | MEDICATED_PATCH | TRANSDERMAL | Status: DC
  Administered 2018-02-16: 21 mg via TRANSDERMAL
  Filled 2018-02-16: qty 1

## 2018-02-16 MED ORDER — CEFAZOLIN SODIUM-DEXTROSE 1-4 GM/50ML-% IV SOLN
1.0000 g | Freq: Four times a day (QID) | INTRAVENOUS | Status: AC
  Administered 2018-02-16 (×2): 1 g via INTRAVENOUS
  Filled 2018-02-16 (×2): qty 50

## 2018-02-16 MED ORDER — PHENYLEPHRINE 40 MCG/ML (10ML) SYRINGE FOR IV PUSH (FOR BLOOD PRESSURE SUPPORT)
PREFILLED_SYRINGE | INTRAVENOUS | Status: AC
  Filled 2018-02-16: qty 10

## 2018-02-16 MED ORDER — CEFAZOLIN SODIUM-DEXTROSE 2-4 GM/100ML-% IV SOLN
2.0000 g | INTRAVENOUS | Status: AC
  Administered 2018-02-16: 2 g via INTRAVENOUS
  Filled 2018-02-16: qty 100

## 2018-02-16 MED ORDER — METHOCARBAMOL 500 MG PO TABS: 500.0000 mg | ORAL_TABLET | Freq: Four times a day (QID) | ORAL | 1 refills | Status: AC | PRN

## 2018-02-16 MED ORDER — MIDAZOLAM HCL 2 MG/2ML IJ SOLN
1.0000 mg | INTRAMUSCULAR | Status: DC
  Administered 2018-02-16: 2 mg via INTRAVENOUS
  Filled 2018-02-16: qty 2

## 2018-02-16 MED ORDER — SODIUM CHLORIDE 0.9 % IR SOLN
Status: DC | PRN
  Administered 2018-02-16: 1000 mL

## 2018-02-16 MED ORDER — ALBUTEROL SULFATE (2.5 MG/3ML) 0.083% IN NEBU: 2.5000 mg | INHALATION_SOLUTION | Freq: Four times a day (QID) | RESPIRATORY_TRACT | Status: DC | PRN

## 2018-02-16 MED ORDER — ADULT MULTIVITAMIN W/MINERALS CH
1.0000 | ORAL_TABLET | Freq: Every day | ORAL | Status: DC
  Administered 2018-02-17: 1 via ORAL
  Filled 2018-02-16: qty 1

## 2018-02-16 MED ORDER — BISACODYL 10 MG RE SUPP: 10.0000 mg | Freq: Every day | RECTAL | Status: DC | PRN

## 2018-02-16 MED ORDER — DIPHENHYDRAMINE HCL 12.5 MG/5ML PO ELIX: 12.5000 mg | ORAL_SOLUTION | ORAL | Status: DC | PRN

## 2018-02-16 MED ORDER — PROPOFOL 500 MG/50ML IV EMUL
INTRAVENOUS | Status: DC | PRN
  Administered 2018-02-16: 75 ug/kg/min via INTRAVENOUS

## 2018-02-16 MED ORDER — ONDANSETRON HCL 4 MG/2ML IJ SOLN: 4.0000 mg | Freq: Four times a day (QID) | INTRAMUSCULAR | Status: DC | PRN

## 2018-02-16 SURGICAL SUPPLY — 66 items
ANCHOR SUPER QUICK (Anchor) ×3 IMPLANT
BAG ZIPLOCK 12X15 (MISCELLANEOUS) ×3 IMPLANT
BANDAGE ACE 4X5 VEL STRL LF (GAUZE/BANDAGES/DRESSINGS) IMPLANT
BANDAGE ACE 6X5 VEL STRL LF (GAUZE/BANDAGES/DRESSINGS) ×3 IMPLANT
BLADE 10 SAFETY STRL DISP (BLADE) ×3 IMPLANT
BLADE 15 SAFETY STRL DISP (BLADE) ×3 IMPLANT
BLADE SAGITTAL 25.0X1.19X90 (BLADE) ×2 IMPLANT
BLADE SAGITTAL 25.0X1.19X90MM (BLADE) ×1
BLADE SAW SAG 90X13X1.27 (BLADE) ×3 IMPLANT
BNDG GAUZE ELAST 4 BULKY (GAUZE/BANDAGES/DRESSINGS) ×3 IMPLANT
BOWL SMART MIX CTS (DISPOSABLE) ×3 IMPLANT
CEMENT HV SMART SET (Cement) ×6 IMPLANT
CEMENT TIBIA MBT SIZE 4 (Knees) ×1 IMPLANT
COVER SURGICAL LIGHT HANDLE (MISCELLANEOUS) ×3 IMPLANT
CUFF TOURN SGL QUICK 34 (TOURNIQUET CUFF) ×2
CUFF TRNQT CYL 34X4X40X1 (TOURNIQUET CUFF) ×1 IMPLANT
DECANTER SPIKE VIAL GLASS SM (MISCELLANEOUS) ×6 IMPLANT
DRAPE U-SHAPE 47X51 STRL (DRAPES) ×3 IMPLANT
DRSG ADAPTIC 3X8 NADH LF (GAUZE/BANDAGES/DRESSINGS) ×3 IMPLANT
DRSG PAD ABDOMINAL 8X10 ST (GAUZE/BANDAGES/DRESSINGS) ×6 IMPLANT
DURAPREP 26ML APPLICATOR (WOUND CARE) ×6 IMPLANT
ELECT REM PT RETURN 15FT ADLT (MISCELLANEOUS) ×3 IMPLANT
FEMUR RIGHT SZ 4 (Knees) ×3 IMPLANT
GAUZE SPONGE 4X4 12PLY STRL (GAUZE/BANDAGES/DRESSINGS) ×3 IMPLANT
GLOVE BIOGEL PI IND STRL 8 (GLOVE) ×2 IMPLANT
GLOVE BIOGEL PI INDICATOR 8 (GLOVE) ×4
GLOVE SURG ORTHO 8.0 STRL STRW (GLOVE) ×3 IMPLANT
GLOVE SURG SS PI 7.5 STRL IVOR (GLOVE) ×3 IMPLANT
GOWN STRL REUS W/TWL XL LVL3 (GOWN DISPOSABLE) ×6 IMPLANT
HANDPIECE INTERPULSE COAX TIP (DISPOSABLE) ×2
HOLDER FOLEY CATH W/STRAP (MISCELLANEOUS) ×3 IMPLANT
HOOD PEEL AWAY FLYTE STAYCOOL (MISCELLANEOUS) ×6 IMPLANT
IMMOBILIZER KNEE 20 (SOFTGOODS) ×6 IMPLANT
IMMOBILIZER KNEE 20 THIGH 36 (SOFTGOODS) ×1 IMPLANT
MANIFOLD NEPTUNE II (INSTRUMENTS) ×3 IMPLANT
NDL SAFETY ECLIPSE 18X1.5 (NEEDLE) IMPLANT
NEEDLE HYPO 18GX1.5 SHARP (NEEDLE)
NEEDLE HYPO 22GX1.5 SAFETY (NEEDLE) ×3 IMPLANT
NS IRRIG 1000ML POUR BTL (IV SOLUTION) ×3 IMPLANT
PACK ICE MAXI GEL EZY WRAP (MISCELLANEOUS) ×3 IMPLANT
PACK TOTAL KNEE CUSTOM (KITS) ×3 IMPLANT
PAD CAST 4YDX4 CTTN HI CHSV (CAST SUPPLIES) ×1 IMPLANT
PADDING CAST COTTON 4X4 STRL (CAST SUPPLIES) ×2
PADDING CAST COTTON 6X4 STRL (CAST SUPPLIES) ×3 IMPLANT
PATELLA DOME PFC 38MM (Knees) ×3 IMPLANT
PIN STEINMAN FIXATION KNEE (PIN) ×3 IMPLANT
PIN THREADED HEADED SIGMA (PIN) ×3 IMPLANT
PLATE ROT INSERT 12.5MM SIZE 4 (Plate) ×3 IMPLANT
POSITIONER SURGICAL ARM (MISCELLANEOUS) ×3 IMPLANT
SET HNDPC FAN SPRY TIP SCT (DISPOSABLE) ×1 IMPLANT
SPONGE LAP 18X18 RF (DISPOSABLE) IMPLANT
STAPLER VISISTAT 35W (STAPLE) IMPLANT
SUT ETHIBOND NAB CT1 #1 30IN (SUTURE) ×9 IMPLANT
SUT VIC AB 0 CT1 36 (SUTURE) ×3 IMPLANT
SUT VIC AB 1 CT1 36 (SUTURE) IMPLANT
SUT VIC AB 2-0 CT1 27 (SUTURE) ×4
SUT VIC AB 2-0 CT1 TAPERPNT 27 (SUTURE) ×2 IMPLANT
SYR 30ML LL (SYRINGE) IMPLANT
SYR 3ML LL SCALE MARK (SYRINGE) IMPLANT
SYR CONTROL 10ML LL (SYRINGE) ×6 IMPLANT
TIBIA MBT CEMENT SIZE 4 (Knees) ×3 IMPLANT
TOWEL OR 17X26 10 PK STRL BLUE (TOWEL DISPOSABLE) ×3 IMPLANT
TRAY FOLEY MTR SLVR 16FR STAT (SET/KITS/TRAYS/PACK) ×3 IMPLANT
WATER STERILE IRR 1000ML POUR (IV SOLUTION) ×6 IMPLANT
WRAP KNEE MAXI GEL POST OP (GAUZE/BANDAGES/DRESSINGS) ×3 IMPLANT
YANKAUER SUCT BULB TIP 10FT TU (MISCELLANEOUS) ×3 IMPLANT

## 2018-02-16 NOTE — Progress Notes (Signed)
AssistedDr. Carswell Jackson with right, ultrasound guided, adductor canal block. Side rails up, monitors on throughout procedure. See vital signs in flow sheet. Tolerated Procedure well.  

## 2018-02-16 NOTE — Anesthesia Postprocedure Evaluation (Signed)
Anesthesia Post Note  Patient: Hunter Vaughn.  Procedure(s) Performed: RIGHT TOTAL KNEE ARTHROPLASTY (Right Knee)     Patient location during evaluation: PACU Anesthesia Type: Spinal Level of consciousness: awake and alert, oriented and patient cooperative Pain management: pain level controlled Vital Signs Assessment: post-procedure vital signs reviewed and stable Respiratory status: spontaneous breathing, nonlabored ventilation and respiratory function stable Cardiovascular status: blood pressure returned to baseline and stable Postop Assessment: spinal receding, patient able to bend at knees and no apparent nausea or vomiting Anesthetic complications: no    Last Vitals:  Vitals:   02/16/18 1510 02/16/18 1601  BP: 137/75 (!) 144/80  Pulse: 87 86  Resp: 16 20  Temp: 36.7 C 37 C  SpO2: 95% 92%    Last Pain:  Vitals:   02/16/18 1601  TempSrc: Oral  PainSc:                  Zamira Hickam,E. Ed Rayson

## 2018-02-16 NOTE — Op Note (Signed)
NAMEJoyce, Hunter Vaughn MEDICAL RECORD ZO:[id number] ACCOUNT [id number] DATE OF BIRTH:December 10, 1960 FACILITY: [facility]: [id number] PHYSICIAN:W. Kayron Hicklin JR., MD  OPERATIVE REPORT  DATE OF PROCEDURE:  02/16/2018  PREOPERATIVE DIAGNOSIS:  Osteoarthritis, right knee.  POSTOPERATIVE DIAGNOSIS:  Osteoarthritis, right knee.  PROCEDURE:  Right total knee replacement (Sigma size 4 femur, tibia, 12.5 mm medial bearing with 38 mm all poly patella.  SURGEON:  Marcie Mowers, MD  ASSISTANTJanalyn Rouse, [place].  ANESTHESIA:  Spinal anesthetic.  TOURNIQUET TIME:  60 minutes.  DESCRIPTION OF PROCEDURE:  Sterile prep and drape and exsanguination of the leg inflation to 350.  Straight skin incision was made with medial parapatellar approach to the knee made.  We did an 11 mm 5 degree valgus cut on the femur followed by cutting  about 3-4 mm below the most diseased medial compartment after medial stripping due to moderate varus deformity.  Extension gap measured and eventually 12.5 mm.  Femur was sized to be a size 4.  Placement of all all-in-1 cutting block in appropriate  degree of external rotation accomplishing the anterior, posterior chamfer cuts.  PCL was released.  Small osteophytes were removed from the posterior medial aspect of the knee.  Tibia was sized to be a size 4 placed with the keel cut for the tibia  followed by placement of the trial tibial baseplate with a 12.5 mm bearing followed by the box cut in the femur.  The patient had a mild flexion contracture, varus deformity corrected with the trial in.  The patella was cut about a 9.5 mm patellar  resection.  We placed and a 38 mm all poly trial.  Again, all parameters deemed to be acceptable.  Trials were removed.  Final components were inserted.  Cement in the doughy state tibia followed by femur and patella.  Trial bearing was used with the  cement hardened.  Trial bearing was removed.  Small bits of cement were removed at the posterior  aspect of the knee.  Tourniquet was released under direct vision, no excessive bleeding was noted.  Copious irrigation was carried out.  Pulsatile lavage  followed by placement of a 12.5 mm bearing.  Closure was affected with #1 Ethibond, 2-0 Vicryl and skin clips.  A compressive sterile dressing applied, taken to recovery room in stable condition.  TN/NUANCE  D:02/16/2018 T:02/16/2018 JOB:[id number]

## 2018-02-16 NOTE — Transfer of Care (Signed)
Immediate Anesthesia Transfer of Care Note  Patient: Hunter Vaughn.  Procedure(s) Performed: RIGHT TOTAL KNEE ARTHROPLASTY (Right Knee)  Patient Location: PACU  Anesthesia Type:Spinal  Level of Consciousness: awake, alert  and oriented  Airway & Oxygen Therapy: Patient Spontanous Breathing and Patient connected to nasal cannula oxygen  Post-op Assessment: Report given to RN and Post -op Vital signs reviewed and stable  Post vital signs: Reviewed and stable  Last Vitals:  Vitals Value Taken Time  BP    Temp    Pulse 70 02/16/2018  1:01 PM  Resp    SpO2 95 % 02/16/2018  1:01 PM  Vitals shown include unvalidated device data.  Last Pain:  Vitals:   02/16/18 0840  TempSrc:   PainSc: 5       Patients Stated Pain Goal: 4 (02/16/18 0840)  Complications: No apparent anesthesia complications

## 2018-02-16 NOTE — Interval H&P Note (Signed)
History and Physical Interval Note:  02/16/2018 9:41 AM  Hunter Vaughn.  has presented today for surgery, with the diagnosis of OA RIGHT KNEE  The various methods of treatment have been discussed with the patient and family. After consideration of risks, benefits and other options for treatment, the patient has consented to  Procedure(s): RIGHT TOTAL KNEE ARTHROPLASTY (Right) as a surgical intervention .  The patient's history has been reviewed, patient examined, no change in status, stable for surgery.  I have reviewed the patient's chart and labs.  Questions were answered to the patient's satisfaction.     Thera Flake

## 2018-02-16 NOTE — Care Management Note (Signed)
Case Management Note  Patient Details  Name: Hunter Vaughn. MRN: [id number] Date of Birth: [birth year]  Subjective/Objective:   Discharge planning, spoke with patient and spouse at bedside. Have chosen [facility] for [facility] PT, evaluate and treat.  Action/Plan: Contacted [facility] for referral, they have accepted. Needs RW and 3n1, contacted [facility] to deliver to room. [phone]                 Expected Discharge Date:                  Expected Discharge Plan:  Home w Home Health Services  In-House Referral:  NA  Discharge planning Services  CM Consult  Post Acute Care Choice:  Home Health, Durable Medical Equipment Choice offered to:  Patient, Spouse  DME Arranged:  3-N-1, Walker rolling DME Agency:  [facility]  HH Arranged:  PT HH Agency:  [facility] (formerly [facility])  Status of Service:  Completed, signed off  If discussed at [employer], dates discussed:    Additional Comments:  Alexis Goodell, RN 02/16/2018, 4:12 PM

## 2018-02-16 NOTE — Anesthesia Preprocedure Evaluation (Addendum)
Anesthesia Evaluation  Patient identified by MRN, date of birth, ID band Patient awake    Reviewed: Allergy & Precautions, NPO status , Patient's Chart, lab work & pertinent test results  History of Anesthesia Complications Negative for: history of anesthetic complications  Airway Mallampati: II  TM Distance: >3 FB Neck ROM: Full    Dental  (+) Dental Advisory Given   Pulmonary COPD,  COPD inhaler, Current Smoker,    breath sounds clear to auscultation       Cardiovascular hypertension, Pt. on medications (-) angina Rhythm:Regular Rate:Normal     Neuro/Psych negative neurological ROS     GI/Hepatic Neg liver ROS, GERD  Medicated and Controlled,  Endo/Other  Morbid obesity  Renal/GU negative Renal ROS     Musculoskeletal  (+) Arthritis , Osteoarthritis,    Abdominal (+) + obese,   Peds  Hematology negative hematology ROS (+)   Anesthesia Other Findings   Reproductive/Obstetrics                            Anesthesia Physical Anesthesia Plan  ASA: II  Anesthesia Plan: Spinal   Post-op Pain Management:  Regional for Post-op pain   Induction:   PONV Risk Score and Plan: 0 and Ondansetron and Treatment may vary due to age or medical condition  Airway Management Planned: Nasal Cannula and Natural Airway  Additional Equipment:   Intra-op Plan:   Post-operative Plan:   Informed Consent: I have reviewed the patients History and Physical, chart, labs and discussed the procedure including the risks, benefits and alternatives for the proposed anesthesia with the patient or authorized representative who has indicated his/her understanding and acceptance.   Dental advisory given  Plan Discussed with: CRNA and Surgeon  Anesthesia Plan Comments: (Plan routine monitors, SAB with adductor canal block for post op analgesia)        Anesthesia Quick Evaluation

## 2018-02-16 NOTE — Anesthesia Procedure Notes (Signed)
Spinal  Patient location during procedure: OR Start time: 02/16/2018 10:50 AM End time: 02/16/2018 10:54 AM Staffing Anesthesiologist: Jairo Ben, MD Resident/CRNA: Epimenio Sarin, CRNA Performed: resident/CRNA  Preanesthetic Checklist Completed: patient identified, surgical consent, pre-op evaluation, timeout performed, IV checked, risks and benefits discussed and monitors and equipment checked Spinal Block Patient position: sitting Prep: DuraPrep Patient monitoring: heart rate, cardiac monitor, continuous pulse ox and blood pressure Approach: midline Location: L3-4 Injection technique: single-shot Needle Needle type: Pencan  Needle gauge: 24 G Needle length: 10 cm Needle insertion depth: 7 cm Assessment Sensory level: T6 Additional Notes Lot [id number] Exp 2019-02-20 Timeout performed. Negative heme/parasthesia. Positive CSF pre and post injection. VSS.

## 2018-02-16 NOTE — Evaluation (Signed)
Physical Therapy Evaluation Patient Details Name: Hunter Vaughn. MRN: [id number] DOB: 03/10/1961 Today's Date: 02/16/2018   History of Present Illness  Pt is a 57 YO male s/p R TKR on 02/16/18. PMH includes HTN, GERD.   Clinical Impression  Pt s/p R TKR. Pt presents with some difficulty with mobility tasks, impulsivity with mobility, and R knee pain. Pt to benefit from acute PT to address deficits. Pt ambulated hallway distance with PT today. PT to continue to progress mobility as able, and will continue to follow acutely.     Follow Up Recommendations Follow surgeon's recommendation for DC plan and follow-up therapies;Supervision for mobility/OOB(HHPT )    Equipment Recommendations  None recommended by PT    Recommendations for Other Services       Precautions / Restrictions Precautions Precautions: Fall;Knee Required Braces or Orthoses: Knee Immobilizer - Right Knee Immobilizer - Right: On when out of bed or walking Restrictions Weight Bearing Restrictions: No Other Position/Activity Restrictions: WBAT       Mobility  Bed Mobility Overal bed mobility: Needs Assistance Bed Mobility: Supine to Sit     Supine to sit: Min guard;HOB elevated     General bed mobility comments: Min guard for safety. Verbal cuing for sequencing to get to EOB.   Transfers Overall transfer level: Needs assistance Equipment used: Rolling walker (2 wheeled) Transfers: Sit to/from Stand Sit to Stand: Min guard;From elevated surface         General transfer comment: Min guard for safety. Immediately after standing, pt trying to ambulate. Pt encouraged to slow down. Verbal cuing for hand placement on RW x2.   Ambulation/Gait Ambulation/Gait assistance: Min guard Gait Distance (Feet): 80 Feet Assistive device: Rolling walker (2 wheeled) Gait Pattern/deviations: Step-through pattern;Step-to pattern Gait velocity: decr    General Gait Details: Pt starting with step-to, progressing to  step-through pattern. Pt with quickened pace toward end of ambulation, again PT reminding pt to slow down as needed.   Stairs            Wheelchair Mobility    Modified Rankin (Stroke Patients Only)       Balance Overall balance assessment: Mild deficits observed, not formally tested                                           Pertinent Vitals/Pain Pain Assessment: 0-10 Pain Score: 5  Pain Location: R knee  Pain Descriptors / Indicators: Aching Pain Intervention(s): Limited activity within patient's tolerance;Ice applied;Monitored during session;Repositioned    Home Living Family/patient expects to be discharged to:: Private residence Living Arrangements: Spouse/significant other Available Help at Discharge: Family;Available PRN/intermittently Type of Home: Mobile home Home Access: Stairs to enter Entrance Stairs-Rails: Left Entrance Stairs-Number of Steps: 3 Home Layout: One level Home Equipment: Walker - 2 wheels;Bedside commode;Crutches      Prior Function Level of Independence: Independent               Hand Dominance   Dominant Hand: Left    Extremity/Trunk Assessment   Upper Extremity Assessment Upper Extremity Assessment: Overall WFL for tasks assessed    Lower Extremity Assessment Lower Extremity Assessment: Overall WFL for tasks assessed;RLE deficits/detail RLE Deficits / Details: Suspected post-surgical weakness; able to perform SLR x3 with donning/doffing immobilizer, quad sets x5 RLE Sensation: WNL    Cervical / Trunk Assessment Cervical / Trunk Assessment: Normal  Communication  Communication: No difficulties  Cognition Arousal/Alertness: Awake/alert Behavior During Therapy: WFL for tasks assessed/performed;Impulsive Overall Cognitive Status: Within Functional Limits for tasks assessed                                 General Comments: Eager to mobilize, moves quickly       General Comments       Exercises Total Joint Exercises Ankle Circles/Pumps: AROM;Both;10 reps;Supine Quad Sets: AROM;Right;5 reps;Supine   Assessment/Plan    PT Assessment    PT Problem List         PT Treatment Interventions      PT Goals (Current goals can be found in the Care Plan section)  Acute Rehab PT Goals Patient Stated Goal: none stated  PT Goal Formulation: With patient Time For Goal Achievement: 03/02/18 Potential to Achieve Goals: Good    Frequency     Barriers to discharge        Co-evaluation               AM-PAC PT "6 Clicks" Daily Activity  Outcome Measure Difficulty turning over in bed (including adjusting bedclothes, sheets and blankets)?: Unable Difficulty moving from lying on back to sitting on the side of the bed? : Unable Difficulty sitting down on and standing up from a chair with arms (e.g., wheelchair, bedside commode, etc,.)?: Unable Help needed moving to and from a bed to chair (including a wheelchair)?: None Help needed walking in hospital room?: None Help needed climbing 3-5 steps with a railing? : A Little 6 Click Score: 14    End of Session Equipment Utilized During Treatment: Gait belt Activity Tolerance: Patient tolerated treatment well;No increased pain Patient left: in bed;with bed alarm set;with call bell/phone within reach;with SCD's reapplied Nurse Communication: Mobility status(Called Ortho tech to put pt back in CPM device )      Time: 1610-9604 PT Time Calculation (min) (ACUTE ONLY): 29 min   Charges:   PT Evaluation $PT Eval Low Complexity: 1 Low PT Treatments $Gait Training: 8-22 mins        Nicola Police, PT Acute Rehabilitation Services Pager [phone]  Office [phone]   Markita Stcharles D Despina Hidden 02/16/2018, 5:49 PM

## 2018-02-16 NOTE — Discharge Instructions (Signed)

## 2018-02-16 NOTE — Anesthesia Procedure Notes (Signed)
Anesthesia Regional Block: Adductor canal block   Pre-Anesthetic Checklist: ,, timeout performed, Correct Patient, Correct Site, Correct Laterality, Correct Procedure, Correct Position, site marked, Risks and benefits discussed,  Surgical consent,  Pre-op evaluation,  At surgeon's request and post-op pain management  Laterality: Right and Lower  Prep: chloraprep       Needles:  Injection technique: Single-shot  Needle Type: Echogenic Needle     Needle Length: 9cm  Needle Gauge: 21     Additional Needles:   Procedures:,,,, ultrasound used (permanent image in chart),,,,  Narrative:  Start time: 02/16/2018 10:01 AM End time: 02/16/2018 10:09 AM Injection made incrementally with aspirations every 5 mL.  Performed by: Personally  Anesthesiologist: Jairo Ben, MD  Additional Notes: Pt identified in Holding room.  Monitors applied. Working IV access confirmed. Sterile prep.  #21ga ECHOgenic needle into adductor canal with US guidance.  20cc 0.75% Ropivacaine injected incrementally after negative test dose.  Patient asymptomatic, VSS, no heme aspirated, tolerated well.  Sandford Craze, MD

## 2018-02-16 NOTE — Brief Op Note (Signed)
02/16/2018  12:58 PM  PATIENT:  Hunter Vaughn.  57 y.o. male  PRE-OPERATIVE DIAGNOSIS:  OA RIGHT KNEE  POST-OPERATIVE DIAGNOSIS:  OA RIGHT KNEE  PROCEDURE:  Procedure(s): RIGHT TOTAL KNEE ARTHROPLASTY (Right)  SURGEON:  Surgeon(s) and Role:    Frederico Hamman, MD - Primary  PHYSICIAN ASSISTANT: Margart Sickles, PA-C  ASSISTANTS: OR staff x1   ANESTHESIA:   local, regional, spinal and IV sedation  EBL:  25 mL   BLOOD ADMINISTERED:none  DRAINS: none   LOCAL MEDICATIONS USED:  MARCAINE     SPECIMEN:  No Specimen  DISPOSITION OF SPECIMEN:  N/A  COUNTS:  YES  TOURNIQUET:   Total Tourniquet Time Documented: Thigh (Right) - 62 minutes Total: Thigh (Right) - 62 minutes   DICTATION: .Other Dictation: Dictation Number unknown  PLAN OF CARE: Admit to inpatient   PATIENT DISPOSITION:  PACU - hemodynamically stable.   Delay start of Pharmacological VTE agent (>24hrs) due to surgical blood loss or risk of bleeding: yes

## 2018-02-17 LAB — CBC
HCT: 44 % (ref 39.0–52.0)
Hemoglobin: 14.1 g/dL (ref 13.0–17.0)
MCH: 29.5 pg (ref 26.0–34.0)
MCHC: 32 g/dL (ref 30.0–36.0)
MCV: 92.1 fL (ref 78.0–100.0)
Platelets: 274 10*3/uL (ref 150–400)
RBC: 4.78 MIL/uL (ref 4.22–5.81)
RDW: 13.5 % (ref 11.5–15.5)
WBC: 22.4 10*3/uL — AB (ref 4.0–10.5)

## 2018-02-17 LAB — BASIC METABOLIC PANEL
ANION GAP: 6 (ref 5–15)
BUN: 16 mg/dL (ref 6–20)
CALCIUM: 8.8 mg/dL — AB (ref 8.9–10.3)
CO2: 30 mmol/L (ref 22–32)
Chloride: 103 mmol/L (ref 98–111)
Creatinine, Ser: 0.97 mg/dL (ref 0.61–1.24)
GLUCOSE: 148 mg/dL — AB (ref 70–99)
Potassium: 5.3 mmol/L — ABNORMAL HIGH (ref 3.5–5.1)
Sodium: 139 mmol/L (ref 135–145)

## 2018-02-17 NOTE — Discharge Summary (Signed)
Discharge Summary  Patient ID: Hunter Vaughn. MRN: [id number] DOB/AGE: 59-27-1962 59 y.o.  Admit date: 02/16/2018 Discharge date: 02/17/2018  Admission Diagnoses:  Primary localized osteoarthritis of right knee  Discharge Diagnoses:  Principal Problem:   Primary localized osteoarthritis of right knee Active Problems:   S/P knee replacement   Past Medical History:  Diagnosis Date  . Arthritis   . GERD (gastroesophageal reflux disease)   . History of kidney stones 2014/10/18   PASSED ON OWN X 1   . Hypertension     Surgeries: Procedure(s): RIGHT TOTAL KNEE ARTHROPLASTY on 02/16/2018   Consultants (if any):   Discharged Condition: Improved  Hospital Course: Hunter Vane. is an 57 y.o. male who was admitted 02/16/2018 with a diagnosis of Primary localized osteoarthritis of right knee and went to the operating room on 02/16/2018 and underwent the above named procedures.    He was given perioperative antibiotics:  Anti-infectives (From admission, onward)   Start     Dose/Rate Route Frequency Ordered Stop   02/16/18 1700  ceFAZolin (ANCEF) IVPB 1 g/50 mL premix     1 g 100 mL/hr over 30 Minutes Intravenous Every 6 hours 02/16/18 1419 02/16/18 2300   02/16/18 0830  ceFAZolin (ANCEF) IVPB 2g/100 mL premix     2 g 200 mL/hr over 30 Minutes Intravenous On call to O.R. 02/16/18 4098 02/16/18 1048    .  He was given sequential compression devices, early ambulation, and Eliquis for DVT prophylaxis.  He benefited maximally from the hospital stay and there were no complications.    Recent vital signs:  Vitals:   02/17/18 0128 02/17/18 0523  BP: 140/82 (!) 139/92  Pulse: 79 77  Resp: 16 16  Temp: 97.9 F (36.6 C) 97.7 F (36.5 C)  SpO2: 91%     Recent laboratory studies:  Lab Results  Component Value Date   HGB 14.1 02/17/2018   HGB 16.2 02/12/2018   Lab Results  Component Value Date   WBC 22.4 (H) 02/17/2018   PLT 274 02/17/2018   Lab Results  Component Value Date    INR 0.90 02/12/2018   Lab Results  Component Value Date   NA 139 02/17/2018   K 5.3 (H) 02/17/2018   CL 103 02/17/2018   CO2 30 02/17/2018   BUN 16 02/17/2018   CREATININE 0.97 02/17/2018   GLUCOSE 148 (H) 02/17/2018    Discharge Medications:   Allergies as of 02/17/2018   No Known Allergies     Medication List    STOP taking these medications   ibuprofen 200 MG tablet Commonly known as:  ADVIL,MOTRIN   Testosterone 20.25 MG/ACT (1.62%) Gel     TAKE these medications   acetaminophen 325 MG tablet Commonly known as:  TYLENOL Take 2 tablets (650 mg total) by mouth every 6 (six) hours as needed.   albuterol 108 (90 Base) MCG/ACT inhaler Commonly known as:  PROVENTIL HFA;VENTOLIN HFA Inhale 1-2 puffs into the lungs every 6 (six) hours as needed for wheezing or shortness of breath.   aspirin EC 81 MG tablet Take 1 tablet (81 mg total) by mouth 2 (two) times daily.   celecoxib 200 MG capsule Commonly known as:  CELEBREX Take 200 mg by mouth daily.   docusate sodium 100 MG capsule Commonly known as:  COLACE Take 1 capsule (100 mg total) by mouth daily as needed.   loratadine-pseudoephedrine 5-120 MG tablet Commonly known as:  CLARITIN-D 12-hour Take 1 tablet by mouth daily.  Magnesium 250 MG Tabs Take 250 mg by mouth daily.   methocarbamol 500 MG tablet Commonly known as:  ROBAXIN Take 1 tablet (500 mg total) by mouth every 6 (six) hours as needed for muscle spasms.   multivitamin with minerals Tabs tablet Take 1 tablet by mouth daily.   omega-3 fish oil 1000 MG Caps capsule Commonly known as:  MAXEPA Take 2,000 mg by mouth daily.   oxyCODONE 5 MG immediate release tablet Commonly known as:  Oxy IR/ROXICODONE Take 1 tab po q4hrs prn pain, may need 1-2 first few weeks   pantoprazole 40 MG tablet Commonly known as:  PROTONIX Take 40 mg by mouth daily.   rosuvastatin 5 MG tablet Commonly known as:  CRESTOR Take 5 mg by mouth daily.   sildenafil 100  MG tablet Commonly known as:  VIAGRA Take 100 mg by mouth daily as needed for erectile dysfunction.   telmisartan-hydrochlorothiazide 40-12.5 MG tablet Commonly known as:  MICARDIS HCT Take 1 tablet by mouth daily.   temazepam 15 MG capsule Commonly known as:  RESTORIL Take 15 mg by mouth at bedtime as needed for sleep.   Zinc 50 MG Tabs Take 50 mg by mouth daily.            Durable Medical Equipment  (From admission, onward)         Start     Ordered   02/16/18 1420  DME Walker rolling  Once    Question:  Patient needs a walker to treat with the following condition  Answer:  Primary localized osteoarthritis of right knee   02/16/18 1419   02/16/18 1420  DME 3 n 1  Once     02/16/18 1419          Diagnostic Studies: Dg Chest 2 View  Result Date: 02/12/2018 CLINICAL DATA:  Pre-op CXR, HTN, smoker, no other chest complaints EXAM: CHEST - 2 VIEW COMPARISON:  Chest CT on 10/19/2012 FINDINGS: The heart size and mediastinal contours are within normal limits. Both lungs are clear. The visualized skeletal structures are unremarkable. IMPRESSION: No active cardiopulmonary disease. Electronically Signed   By: Norva Pavlov M.D.   On: 02/12/2018 15:58    Disposition: Discharge disposition: 01-Home or Self Care       Discharge Instructions    Discharge patient   Complete by:  As directed    After therapy   Discharge disposition:  01-Home or Self Care   Discharge patient date:  02/17/2018      Follow-up Information    Frederico Hamman, MD. Schedule an appointment as soon as possible for a visit in 2 weeks.   Specialty:  Orthopedic Surgery Contact information: [address] [place] [address] [phone]        [facility] up.   Specialty:  Home Health Services Why:  physical therapy Contact information: [address] [place] [address] [place] [address] [phone]            Signed: Albina Billet III  PA-C 02/17/2018, 6:42 AM

## 2018-02-17 NOTE — Progress Notes (Signed)
Physical Therapy Treatment Patient Details Name: Hunter Vaughn. MRN: [id number] DOB: 1961/02/03 Today's Date: 02/17/2018    History of Present Illness Pt is a 57 YO male s/p R TKR on 02/16/18. PMH includes HTN, GERD.     PT Comments    Pt tolerated session well. Significant other present for demonstration and performance of HEP with handout given. Also performed the steps and ambulation. Pt and significant other ready for DC with no further concerns.   Follow Up Recommendations  Follow surgeon's recommendation for DC plan and follow-up therapies;Supervision for mobility/OOB(HHPT )     Equipment Recommendations  None recommended by PT    Recommendations for Other Services       Precautions / Restrictions Precautions Precautions: Fall;Knee Precaution Comments: Did not use KI--pt can straight leg raise Restrictions Weight Bearing Restrictions: No Other Position/Activity Restrictions: WBAT     Mobility  Bed Mobility Overal bed mobility: Modified Independent Bed Mobility: Supine to Sit     Supine to sit: Modified independent (Device/Increase time);HOB elevated        Transfers Overall transfer level: Modified independent Equipment used: Rolling walker (2 wheeled) Transfers: Sit to/from Stand Sit to Stand: Modified independent (Device/Increase time)         General transfer comment: reminder to control decent and not plop .   Ambulation/Gait Ambulation/Gait assistance: Min guard Gait Distance (Feet): 100 Feet(twice . Ambulated to and from the gym after sittng rest )   Gait Pattern/deviations: Step-to pattern;Step-through pattern     General Gait Details: began with step to pattern and then progressed to step to pattern    Stairs Stairs: Yes Stairs assistance: Min guard Stair Management: Two rails Number of Stairs: 2     Wheelchair Mobility    Modified Rankin (Stroke Patients Only)       Balance                                             Cognition Arousal/Alertness: Awake/alert Behavior During Therapy: WFL for tasks assessed/performed;Impulsive Overall Cognitive Status: Within Functional Limits for tasks assessed                                 General Comments: Eager to mobilize, moves quickly       Exercises Total Joint Exercises Ankle Circles/Pumps: AROM;Both;10 reps;Supine Quad Sets: AROM;Right;Supine;10 reps Heel Slides: AAROM;Right;10 reps;Supine Hip ABduction/ADduction: AAROM;10 reps;Supine;Right Straight Leg Raises: AAROM;10 reps;Supine;Right Long [employer]: AAROM;10 reps;Seated Knee Flexion: AROM;5 reps;Right;Seated Goniometric ROM: 99 seated edge of mat table.     General Comments        Pertinent Vitals/Pain Pain Assessment: 0-10 Pain Score: 5  Pain Location: 5 knee Pain Descriptors / Indicators: Aching Pain Intervention(s): Monitored during session;Ice applied    Home Living Family/patient expects to be discharged to:: Private residence Living Arrangements: Spouse/significant other Available Help at Discharge: Family;Available PRN/intermittently Type of Home: Mobile home Home Access: Stairs to enter Entrance Stairs-Rails: Left Home Layout: One level Home Equipment: Walker - 2 wheels;Bedside commode;Crutches;Hand held shower head      Prior Function Level of Independence: Independent          PT Goals (current goals can now be found in the care plan section) Acute Rehab PT Goals Patient Stated Goal: home today PT Goal Formulation: With patient Time For  Goal Achievement: 03/02/18 Potential to Achieve Goals: Good Progress towards PT goals: Goals met/education completed, patient discharged from PT    Frequency    7X/week      PT Plan      Co-evaluation              AM-PAC PT "6 Clicks" Daily Activity  Outcome Measure  Difficulty turning over in bed (including adjusting bedclothes, sheets and blankets)?: None Difficulty moving from lying on  back to sitting on the side of the bed? : None Difficulty sitting down on and standing up from a chair with arms (e.g., wheelchair, bedside commode, etc,.)?: None Help needed moving to and from a bed to chair (including a wheelchair)?: None Help needed walking in hospital room?: None Help needed climbing 3-5 steps with a railing? : None 6 Click Score: 24    End of Session Equipment Utilized During Treatment: Gait belt Activity Tolerance: Patient tolerated treatment well;No increased pain Patient left: in chair;with family/visitor present(nurse in room pt ready to go home ) Nurse Communication: Mobility status(Called Ortho tech to put pt back in CPM device )       Time: 1537-9432 PT Time Calculation (min) (ACUTE ONLY): 44 min  Charges:  $Gait Training: 8-22 mins $Therapeutic Exercise: 8-22 mins                     Clide Dales, PT Acute Rehabilitation Services Pager: [phone] Office: [phone] 02/17/2018    Clide Dales 02/17/2018, 11:34 AM

## 2018-02-17 NOTE — Progress Notes (Signed)
    Subjective: Patient reports pain as mild to moderate.  Tolerating diet.  Urinating.  No CP, SOB.   OOB mobilizing well with therapy.  Objective:   VITALS:   Vitals:   02/16/18 1658 02/16/18 2145 02/17/18 0128 02/17/18 0523  BP: 122/74 136/72 140/82 (!) 139/92  Pulse: 93 82 79 77  Resp:  16 16 16   Temp: 99.5 F (37.5 C) 98.3 F (36.8 C) 97.9 F (36.6 C) 97.7 F (36.5 C)  TempSrc: Oral Oral Oral Oral  SpO2: 91% 93% 91%   Weight:      Height:       CBC Latest Ref Rng & Units 02/17/2018 02/12/2018  WBC 4.0 - 10.5 K/uL 22.4(H) 11.5(H)  Hemoglobin 13.0 - 17.0 g/dL 16.1 09.6  Hematocrit 04.5 - 52.0 % 44.0 48.5  Platelets 150 - 400 K/uL 274 287   BMP Latest Ref Rng & Units 02/17/2018 02/12/2018  Glucose 70 - 99 mg/dL 409(W) 119(J)  BUN 6 - 20 mg/dL 16 16  Creatinine 4.78 - 1.24 mg/dL 2.95 6.21  Sodium 308 - 145 mmol/L 139 142  Potassium 3.5 - 5.1 mmol/L 5.3(H) 4.2  Chloride 98 - 111 mmol/L 103 99  CO2 22 - 32 mmol/L 30 33(H)  Calcium 8.9 - 10.3 mg/dL 6.5(H) 9.5   Intake/Output      09/27 0701 - 09/28 0700   P.O. 660   I.V. (mL/kg) 3049.4 (27.5)   IV Piggyback 50   Total Intake(mL/kg) 3759.4 (33.9)   Urine (mL/kg/hr) 1875   Stool 0   Blood 25   Total Output 1900   Net +1859.4       Stool Occurrence 0 x      Physical Exam: General: NAD.  Upright in bed on arrival.  Calm, conversant. Resp: No increased wob Cardio: regular rate and rhythm ABD soft Neurologically intact MSK RLE: Neurovascularly intact Sensation intact distally Intact pulses distally Dorsiflexion/Plantar flexion intact Incision: dressing C/D/I   Assessment: 1 Day Post-Op  S/P Procedure(s) (LRB): RIGHT TOTAL KNEE ARTHROPLASTY (Right) by Dr. Madelon Lips on 02/16/2018  Principal Problem:   Primary localized osteoarthritis of right knee Active Problems:   S/P knee replacement   Primary osteoarthritis, status post right total knee arthroplasty Doing well postop day 1 Eating, drinking, and  voiding Pain controlled Mobilizing well  Plan: Advance diet Up with therapy Incentive Spirometry Elevate and Apply ice CPM, bone foam  Weight Bearing: Weight Bearing as Tolerated (WBAT) RLE Dressings: Aquacel placed this morning.  Maintain.  Please apply thigh high TED hose prior to discharge. VTE prophylaxis: Eliquis, SCDs, ambulation Dispo: Home after therapy this morning    Patient's anticipated LOS is less than 2 midnights, meeting these requirements: - Younger than 70 - Lives within 1 hour of care - Has a competent adult at home to recover with post-op recover    Albina Billet III, PA-C 02/17/2018, 6:39 AM

## 2018-02-17 NOTE — Evaluation (Signed)
Occupational Therapy Evaluation and Discharge Patient Details Name: Hunter Vaughn. MRN: [id number] DOB: [birth year] Today's Date: 02/17/2018    History of Present Illness Pt is a 57 YO male s/p R TKR on 02/16/18. PMH includes HTN, GERD.    Clinical Impression   This 57 yo male admitted and underwent above presents to acute OT with all education completed, we will D/C from acute OT.    Follow Up Recommendations  No OT follow up;Supervision - Intermittent    Equipment Recommendations  3 in 1 bedside commode       Precautions / Restrictions Precautions Precautions: Fall;Knee Precaution Comments: Did not use KI--pt can straight leg raise Restrictions Weight Bearing Restrictions: No Other Position/Activity Restrictions: WBAT       Mobility Bed Mobility Overal bed mobility: Modified Independent Bed Mobility: Supine to Sit     Supine to sit: Modified independent (Device/Increase time);HOB elevated        Transfers Overall transfer level: Modified independent Equipment used: Rolling walker (2 wheeled) Transfers: Sit to/from Stand Sit to Stand: Modified independent (Device/Increase time)                  ADL either performed or assessed with clinical judgement   ADL                                         General ADL Comments: Educated pt on sequence of dressing and pt able to put socks, underwear, and pants on post set up. Pt able to ambulate to 3n1 over toliet Mod I. Demonstrated to pt how to step into walk in shower stall with RW. Pt asked if he could take a tub bath with his RLE propped on side of tub--I told him he could take a bath but could not submerge RLE in water until cleared by surgeon.     Vision Patient Visual Report: No change from baseline              Pertinent Vitals/Pain Pain Assessment: 0-10 Pain Score: 4  Pain Location: R knee  Pain Descriptors / Indicators: Aching Pain Intervention(s): Limited activity within patient's  tolerance;Monitored during session(pt did not want ice on knee post our session)     Hand Dominance Left   Extremity/Trunk Assessment Upper Extremity Assessment Upper Extremity Assessment: Overall WFL for tasks assessed           Communication Communication Communication: No difficulties   Cognition Arousal/Alertness: Awake/alert   Overall Cognitive Status: Within Functional Limits for tasks assessed                                                Home Living Family/patient expects to be discharged to:: Private residence Living Arrangements: Spouse/significant other Available Help at Discharge: Family;Available PRN/intermittently Type of Home: Mobile home Home Access: Stairs to enter Entrance Stairs-Number of Steps: 3 Entrance Stairs-Rails: Left Home Layout: One level     Bathroom Shower/Tub: Tub/shower unit;Walk-in shower   Bathroom Toilet: Standard Bathroom Accessibility: Yes   Home Equipment: Walker - 2 wheels;Bedside commode;Crutches;Hand held shower head          Prior Functioning/Environment Level of Independence: Independent                 OT Problem  List: Decreased range of motion;Impaired balance (sitting and/or standing);Pain         OT Goals(Current goals can be found in the care plan section) Acute Rehab OT Goals Patient Stated Goal: home today  OT Frequency:                AM-PAC PT "6 Clicks" Daily Activity     Outcome Measure Help from another person eating meals?: None Help from another person taking care of personal grooming?: None Help from another person toileting, which includes using toliet, bedpan, or urinal?: None Help from another person bathing (including washing, rinsing, drying)?: A Little Help from another person to put on and taking off regular upper body clothing?: A Little Help from another person to put on and taking off regular lower body clothing?: A Little 6 Click Score: 21   End of  Session Equipment Utilized During Treatment: Rolling walker  Activity Tolerance: Patient tolerated treatment well Patient left: in chair;with call bell/phone within reach;with chair alarm set  OT Visit Diagnosis: Other abnormalities of gait and mobility (R26.89);Pain Pain - Right/Left: Right Pain - part of body: Knee                Time: 1610-9604 OT Time Calculation (min): 27 min Charges:  OT General Charges $OT Visit: 1 Visit OT Evaluation $OT Eval Moderate Complexity: 1 Mod OT Treatments $Self Care/Home Management : 8-22 mins  Ignacia Palma, OTR/L Acute [employer] Pager [phone] Office [phone]

## 2018-02-19 ENCOUNTER — Encounter (HOSPITAL_COMMUNITY): Payer: Self-pay | Admitting: Orthopedic Surgery
